# Patient Record
Sex: Male | Born: 1955 | Race: White | Hispanic: No | Marital: Married | Smoking: Former smoker
Health system: Southern US, Community
[De-identification: ages and names within clinical notes are randomized; demographics above are authoritative.]

## PROBLEM LIST (undated history)

## (undated) ENCOUNTER — Emergency Department: Admission: EM | Discharge status: Absent without leave | Payer: BLUE CROSS/BLUE SHIELD

## (undated) DIAGNOSIS — N200 Calculus of kidney: Secondary | ICD-10-CM

## (undated) DIAGNOSIS — R001 Bradycardia, unspecified: Secondary | ICD-10-CM

## (undated) DIAGNOSIS — E669 Obesity, unspecified: Secondary | ICD-10-CM

## (undated) DIAGNOSIS — M109 Gout, unspecified: Secondary | ICD-10-CM

## (undated) DIAGNOSIS — E7800 Pure hypercholesterolemia, unspecified: Secondary | ICD-10-CM

## (undated) DIAGNOSIS — I10 Essential (primary) hypertension: Secondary | ICD-10-CM

## (undated) DIAGNOSIS — K219 Gastro-esophageal reflux disease without esophagitis: Secondary | ICD-10-CM

## (undated) DIAGNOSIS — G4733 Obstructive sleep apnea (adult) (pediatric): Secondary | ICD-10-CM

## (undated) DIAGNOSIS — M48061 Spinal stenosis, lumbar region without neurogenic claudication: Secondary | ICD-10-CM

## (undated) DIAGNOSIS — N419 Inflammatory disease of prostate, unspecified: Secondary | ICD-10-CM

## (undated) DIAGNOSIS — R7303 Prediabetes: Secondary | ICD-10-CM

## (undated) DIAGNOSIS — Z87442 Personal history of urinary calculi: Secondary | ICD-10-CM

## (undated) DIAGNOSIS — N189 Chronic kidney disease, unspecified: Secondary | ICD-10-CM

## (undated) DIAGNOSIS — N281 Cyst of kidney, acquired: Secondary | ICD-10-CM

## (undated) HISTORY — PX: APPENDECTOMY: SHX54

## (undated) HISTORY — PX: OTHER SURGICAL HISTORY: SHX169

## (undated) HISTORY — PX: LITHOTRIPSY: SUR834

---

## 2014-03-04 ENCOUNTER — Emergency Department: Payer: Self-pay | Admitting: Emergency Medicine

## 2014-03-04 LAB — COMPREHENSIVE METABOLIC PANEL
Albumin: 3.7 g/dL (ref 3.4–5.0)
Alkaline Phosphatase: 85 U/L
Anion Gap: 9 (ref 7–16)
BUN: 24 mg/dL — ABNORMAL HIGH (ref 7–18)
Bilirubin,Total: 0.5 mg/dL (ref 0.2–1.0)
CO2: 20 mmol/L — AB (ref 21–32)
CREATININE: 0.86 mg/dL (ref 0.60–1.30)
Calcium, Total: 8.4 mg/dL — ABNORMAL LOW (ref 8.5–10.1)
Chloride: 110 mmol/L — ABNORMAL HIGH (ref 98–107)
EGFR (Non-African Amer.): >60
Glucose: 110 mg/dL — ABNORMAL HIGH (ref 65–99)
Osmolality: 282 (ref 275–301)
Potassium: 3.9 mmol/L (ref 3.5–5.1)
SGOT(AST): 29 U/L (ref 15–37)
SGPT (ALT): 39 U/L (ref 12–78)
SODIUM: 139 mmol/L (ref 136–145)
Total Protein: 7.2 g/dL (ref 6.4–8.2)

## 2014-03-04 LAB — URINALYSIS, COMPLETE
BILIRUBIN, UR: NEGATIVE
Bacteria: NONE SEEN
GLUCOSE, UR: NEGATIVE mg/dL (ref 0–75)
Hyaline Cast: 1
Leukocyte Esterase: NEGATIVE
NITRITE: NEGATIVE
PH: 5 (ref 4.5–8.0)
Protein: NEGATIVE
Specific Gravity: 1.023 (ref 1.003–1.030)
Squamous Epithelial: NONE SEEN
WBC UR: 2 /HPF (ref 0–5)

## 2014-03-04 LAB — CBC
HCT: 45.5 % (ref 40.0–52.0)
HGB: 15.4 g/dL (ref 13.0–18.0)
MCH: 30.9 pg (ref 26.0–34.0)
MCHC: 33.9 g/dL (ref 32.0–36.0)
MCV: 91 fL (ref 80–100)
Platelet: 166 10*3/uL (ref 150–440)
RBC: 4.99 10*6/uL (ref 4.40–5.90)
RDW: 13.6 % (ref 11.5–14.5)
WBC: 11 10*3/uL — ABNORMAL HIGH (ref 3.8–10.6)

## 2014-03-04 LAB — LIPASE, BLOOD: Lipase: 116 U/L (ref 73–393)

## 2014-03-05 LAB — URINE CULTURE

## 2014-07-21 ENCOUNTER — Emergency Department: Payer: Self-pay | Admitting: Emergency Medicine

## 2014-07-21 LAB — URINALYSIS, COMPLETE
Bacteria: NONE SEEN
Bilirubin,UR: NEGATIVE
GLUCOSE, UR: NEGATIVE mg/dL (ref 0–75)
KETONE: NEGATIVE
Leukocyte Esterase: NEGATIVE
Nitrite: NEGATIVE
PH: 5 (ref 4.5–8.0)
Protein: NEGATIVE
Specific Gravity: 1.024 (ref 1.003–1.030)
WBC UR: 3 /HPF (ref 0–5)

## 2014-07-21 LAB — COMPREHENSIVE METABOLIC PANEL
ALBUMIN: 3.7 g/dL (ref 3.4–5.0)
ANION GAP: 7 (ref 7–16)
Alkaline Phosphatase: 83 U/L
BUN: 23 mg/dL — AB (ref 7–18)
Bilirubin,Total: 0.4 mg/dL (ref 0.2–1.0)
CO2: 24 mmol/L (ref 21–32)
Calcium, Total: 8.6 mg/dL (ref 8.5–10.1)
Chloride: 109 mmol/L — ABNORMAL HIGH (ref 98–107)
Creatinine: 1.05 mg/dL (ref 0.60–1.30)
EGFR (African American): >60
EGFR (Non-African Amer.): >60
GLUCOSE: 109 mg/dL — AB (ref 65–99)
Osmolality: 284 (ref 275–301)
Potassium: 3.9 mmol/L (ref 3.5–5.1)
SGOT(AST): 16 U/L (ref 15–37)
SGPT (ALT): 30 U/L
Sodium: 140 mmol/L (ref 136–145)
Total Protein: 7.2 g/dL (ref 6.4–8.2)

## 2014-07-21 LAB — CBC
HCT: 45.7 % (ref 40.0–52.0)
HGB: 15 g/dL (ref 13.0–18.0)
MCH: 30.6 pg (ref 26.0–34.0)
MCHC: 32.8 g/dL (ref 32.0–36.0)
MCV: 93 fL (ref 80–100)
Platelet: 160 10*3/uL (ref 150–440)
RBC: 4.9 10*6/uL (ref 4.40–5.90)
RDW: 13.3 % (ref 11.5–14.5)
WBC: 9.4 10*3/uL (ref 3.8–10.6)

## 2015-08-31 DIAGNOSIS — E7800 Pure hypercholesterolemia, unspecified: Secondary | ICD-10-CM | POA: Insufficient documentation

## 2015-08-31 DIAGNOSIS — E669 Obesity, unspecified: Secondary | ICD-10-CM | POA: Insufficient documentation

## 2015-08-31 DIAGNOSIS — I10 Essential (primary) hypertension: Secondary | ICD-10-CM | POA: Insufficient documentation

## 2015-10-09 ENCOUNTER — Ambulatory Visit: Payer: BLUE CROSS/BLUE SHIELD | Attending: Neurology

## 2015-10-09 DIAGNOSIS — G4710 Hypersomnia, unspecified: Secondary | ICD-10-CM | POA: Diagnosis present

## 2015-10-09 DIAGNOSIS — Z87442 Personal history of urinary calculi: Secondary | ICD-10-CM | POA: Diagnosis not present

## 2015-10-09 DIAGNOSIS — R001 Bradycardia, unspecified: Secondary | ICD-10-CM | POA: Diagnosis not present

## 2015-10-09 DIAGNOSIS — E785 Hyperlipidemia, unspecified: Secondary | ICD-10-CM | POA: Diagnosis not present

## 2015-10-09 DIAGNOSIS — I10 Essential (primary) hypertension: Secondary | ICD-10-CM | POA: Diagnosis not present

## 2015-10-09 DIAGNOSIS — G4733 Obstructive sleep apnea (adult) (pediatric): Secondary | ICD-10-CM | POA: Insufficient documentation

## 2016-04-29 DIAGNOSIS — Z9989 Dependence on other enabling machines and devices: Secondary | ICD-10-CM | POA: Insufficient documentation

## 2016-04-29 DIAGNOSIS — G4733 Obstructive sleep apnea (adult) (pediatric): Secondary | ICD-10-CM | POA: Insufficient documentation

## 2016-06-14 ENCOUNTER — Encounter: Payer: Self-pay | Admitting: *Deleted

## 2016-06-17 ENCOUNTER — Ambulatory Visit: Payer: BLUE CROSS/BLUE SHIELD | Admitting: Anesthesiology

## 2016-06-17 ENCOUNTER — Encounter: Payer: Self-pay | Admitting: *Deleted

## 2016-06-17 ENCOUNTER — Encounter
Admission: RE | Disposition: A | Discharge status: Home / Self Care | Payer: Self-pay | Source: Ambulatory Visit | Attending: Unknown Physician Specialty

## 2016-06-17 ENCOUNTER — Ambulatory Visit
Admission: RE | Admit: 2016-06-17 | Discharge: 2016-06-17 | Disposition: A | Discharge status: Home / Self Care | Payer: BLUE CROSS/BLUE SHIELD | Source: Ambulatory Visit | Attending: Unknown Physician Specialty | Admitting: Unknown Physician Specialty

## 2016-06-17 DIAGNOSIS — Z87891 Personal history of nicotine dependence: Secondary | ICD-10-CM | POA: Diagnosis not present

## 2016-06-17 DIAGNOSIS — Z79899 Other long term (current) drug therapy: Secondary | ICD-10-CM | POA: Diagnosis not present

## 2016-06-17 DIAGNOSIS — E7800 Pure hypercholesterolemia, unspecified: Secondary | ICD-10-CM | POA: Insufficient documentation

## 2016-06-17 DIAGNOSIS — N189 Chronic kidney disease, unspecified: Secondary | ICD-10-CM | POA: Diagnosis not present

## 2016-06-17 DIAGNOSIS — R001 Bradycardia, unspecified: Secondary | ICD-10-CM | POA: Diagnosis not present

## 2016-06-17 DIAGNOSIS — Z87442 Personal history of urinary calculi: Secondary | ICD-10-CM | POA: Insufficient documentation

## 2016-06-17 DIAGNOSIS — Z6836 Body mass index (BMI) 36.0-36.9, adult: Secondary | ICD-10-CM | POA: Diagnosis not present

## 2016-06-17 DIAGNOSIS — Z1211 Encounter for screening for malignant neoplasm of colon: Secondary | ICD-10-CM | POA: Diagnosis not present

## 2016-06-17 DIAGNOSIS — Z7982 Long term (current) use of aspirin: Secondary | ICD-10-CM | POA: Diagnosis not present

## 2016-06-17 DIAGNOSIS — I129 Hypertensive chronic kidney disease with stage 1 through stage 4 chronic kidney disease, or unspecified chronic kidney disease: Secondary | ICD-10-CM | POA: Insufficient documentation

## 2016-06-17 DIAGNOSIS — K5730 Diverticulosis of large intestine without perforation or abscess without bleeding: Secondary | ICD-10-CM | POA: Insufficient documentation

## 2016-06-17 DIAGNOSIS — K640 First degree hemorrhoids: Secondary | ICD-10-CM | POA: Insufficient documentation

## 2016-06-17 DIAGNOSIS — E669 Obesity, unspecified: Secondary | ICD-10-CM | POA: Diagnosis not present

## 2016-06-17 HISTORY — DX: Bradycardia, unspecified: R00.1

## 2016-06-17 HISTORY — DX: Essential (primary) hypertension: I10

## 2016-06-17 HISTORY — DX: Obesity, unspecified: E66.9

## 2016-06-17 HISTORY — DX: Calculus of kidney: N20.0

## 2016-06-17 HISTORY — DX: Pure hypercholesterolemia, unspecified: E78.00

## 2016-06-17 HISTORY — DX: Chronic kidney disease, unspecified: N18.9

## 2016-06-17 HISTORY — PX: COLONOSCOPY WITH PROPOFOL: SHX5780

## 2016-06-17 SURGERY — COLONOSCOPY WITH PROPOFOL
Anesthesia: General

## 2016-06-17 MED ORDER — PROPOFOL 10 MG/ML IV BOLUS
INTRAVENOUS | Status: DC | PRN
  Administered 2016-06-17: 10 mg via INTRAVENOUS
  Administered 2016-06-17: 30 mg via INTRAVENOUS

## 2016-06-17 MED ORDER — SODIUM CHLORIDE 0.9 % IV SOLN: INTRAVENOUS | Status: DC

## 2016-06-17 MED ORDER — FENTANYL CITRATE (PF) 100 MCG/2ML IJ SOLN: 25.0000 ug | INTRAMUSCULAR | Status: DC | PRN

## 2016-06-17 MED ORDER — MIDAZOLAM HCL 2 MG/2ML IJ SOLN
INTRAMUSCULAR | Status: DC | PRN
  Administered 2016-06-17: 1 mg via INTRAVENOUS

## 2016-06-17 MED ORDER — SODIUM CHLORIDE 0.9 % IV SOLN
INTRAVENOUS | Status: DC
  Administered 2016-06-17: 13:00:00 via INTRAVENOUS

## 2016-06-17 MED ORDER — PROPOFOL 500 MG/50ML IV EMUL
INTRAVENOUS | Status: DC | PRN
  Administered 2016-06-17: 140 ug/kg/min via INTRAVENOUS

## 2016-06-17 MED ORDER — ONDANSETRON HCL 4 MG/2ML IJ SOLN: 4.0000 mg | Freq: Once | INTRAMUSCULAR | Status: DC | PRN

## 2016-06-17 NOTE — Op Note (Signed)
[facility] Patient Name: [name] Procedure Date: 06/17/2016 1:44 PM MRN: [id number] Account #: [id number] Date of Birth: [birth year] Admit Type: Outpatient Age: 60 Room: [facility] ENDO ROOM 1 Gender: Male Note Status: Finalized Procedure:            Colonoscopy Indications:          Screening for colorectal malignant neoplasm Providers:            Manya Silvas, MD Referring MD:         Caprice Renshaw MD (Referring MD) Medicines:            Propofol per Anesthesia Complications:        No immediate complications. Procedure:            Pre-Anesthesia Assessment:                       - After reviewing the risks and benefits, the patient                        was deemed in satisfactory condition to undergo the                        procedure.                       After obtaining informed consent, the colonoscope was                        passed under direct vision. Throughout the procedure,                        the patient's blood pressure, pulse, and oxygen                        saturations were monitored continuously. The was                        introduced through the anus and advanced to the the                        cecum, identified by appendiceal orifice and ileocecal                        valve. The colonoscopy was performed without                        difficulty. The patient tolerated the procedure well.                        The quality of the bowel preparation was good. Findings:      A few small-mouthed diverticula were found in the sigmoid colon.      Internal hemorrhoids were found during endoscopy. The hemorrhoids were       small and Grade I (internal hemorrhoids that do not prolapse). Impression:           - Diverticulosis in the sigmoid colon.                       - Internal hemorrhoids.                       -  No specimens collected. Recommendation:       - Repeat colonoscopy in 10 years for screening  purposes. Manya Silvas, MD 06/17/2016 2:05:45 PM This report has been signed electronically. Number of Addenda: 0 Note Initiated On: 06/17/2016 1:44 PM Scope Withdrawal Time: 0 hours 8 minutes 26 seconds  Total Procedure Duration: 0 hours 12 minutes 59 seconds       [facility]

## 2016-06-17 NOTE — H&P (Signed)
   Primary Care Physician:  Marcello Fennel, MD Primary Gastroenterologist:  Dr. Vira Agar  Pre-Procedure History & Physical: HPI:  Louis Jackson is a 60 y.o. male is here for an colonoscopy.   Past Medical History:  Diagnosis Date  . Bradycardia   . Chronic kidney disease   . Hypertension   . Nephrolithiasis   . Obesity   . Pure hypercholesterolemia, unspecified     Past Surgical History:  Procedure Laterality Date  . APPENDECTOMY    . lithrotripsy      Prior to Admission medications   Medication Sig Start Date End Date Taking? Authorizing Provider  aspirin 81 MG tablet Take 81 mg by mouth daily.   Yes Historical Provider, MD  olmesartan-hydrochlorothiazide (BENICAR HCT) 20-12.5 MG tablet Take 1 tablet by mouth daily.   Yes Historical Provider, MD  rosuvastatin (CRESTOR) 10 MG tablet Take 10 mg by mouth daily.   Yes Historical Provider, MD    Allergies as of 05/16/2016  . (Not on File)    History reviewed. No pertinent family history.  Social History   Social History  . Marital status: Married    Spouse name: N/A  . Number of children: N/A  . Years of education: N/A   Occupational History  . Not on file.   Social History Main Topics  . Smoking status: Never Smoker  . Smokeless tobacco: Former Systems developer  . Alcohol use 8.4 oz/week    14 Cans of beer per week  . Drug use: No  . Sexual activity: Not on file   Other Topics Concern  . Not on file   Social History Narrative  . No narrative on file    Review of Systems: See HPI, otherwise negative ROS  Physical Exam: BP 124/72   Pulse 65   Temp 98.3 F (36.8 C) (Tympanic)   Resp 18   Ht 5\' 11"  (1.803 m)   Wt 120.2 kg (265 lb)   SpO2 98%   BMI 36.96 kg/m  General:   Alert,  pleasant and cooperative in NAD Head:  Normocephalic and atraumatic. Neck:  Supple; no masses or thyromegaly. Lungs:  Clear throughout to auscultation.    Heart:  Regular rate and rhythm. Abdomen:  Soft, nontender and  nondistended. Normal bowel sounds, without guarding, and without rebound.   Neurologic:  Alert and  oriented x4;  grossly normal neurologically.  Impression/Plan: Louis Jackson is here for an colonoscopy to be performed for screening exam of colon.  Risks, benefits, limitations, and alternatives regarding  colonoscopy have been reviewed with the patient.  Questions have been answered.  All parties agreeable.   Gaylyn Cheers, MD  06/17/2016, 1:38 PM

## 2016-06-17 NOTE — Anesthesia Preprocedure Evaluation (Addendum)
Anesthesia Evaluation  Patient identified by MRN, date of birth, ID band Patient awake    Reviewed: Allergy & Precautions, NPO status , Patient's Chart, lab work & pertinent test results  Airway Mallampati: II  TM Distance: >3 FB     Dental  (+) Caps   Pulmonary neg pulmonary ROS, sleep apnea and Continuous Positive Airway Pressure Ventilation ,    Pulmonary exam normal        Cardiovascular hypertension, Pt. on medications Normal cardiovascular exam  bradycardia   Neuro/Psych negative neurological ROS  negative psych ROS   GI/Hepatic negative GI ROS, Neg liver ROS,   Endo/Other  negative endocrine ROS  Renal/GU Renal InsufficiencyRenal disease  negative genitourinary   Musculoskeletal negative musculoskeletal ROS (+)   Abdominal Normal abdominal exam  (+)   Peds negative pediatric ROS (+)  Hematology negative hematology ROS (+)   Anesthesia Other Findings   Reproductive/Obstetrics                            Anesthesia Physical Anesthesia Plan  ASA: III  Anesthesia Plan: General   Post-op Pain Management:    Induction: Intravenous  Airway Management Planned: Nasal Cannula  Additional Equipment:   Intra-op Plan:   Post-operative Plan:   Informed Consent: I have reviewed the patients History and Physical, chart, labs and discussed the procedure including the risks, benefits and alternatives for the proposed anesthesia with the patient or authorized representative who has indicated his/her understanding and acceptance.   Dental advisory given  Plan Discussed with: CRNA and Surgeon  Anesthesia Plan Comments:         Anesthesia Quick Evaluation

## 2016-06-17 NOTE — Transfer of Care (Signed)
Immediate Anesthesia Transfer of Care Note  Patient: Louis Jackson  Procedure(s) Performed: Procedure(s): COLONOSCOPY WITH PROPOFOL (N/A)  Patient Location: PACU  Anesthesia Type:General  Level of Consciousness: sedated  Airway & Oxygen Therapy: Patient Spontanous Breathing and Patient connected to nasal cannula oxygen  Post-op Assessment: Report given to RN and Post -op Vital signs reviewed and stable  Post vital signs: Reviewed and stable  Last Vitals:  Vitals:   06/17/16 1238 06/17/16 1407  BP: 124/72 119/78  Pulse: 65 66  Resp: 18 14  Temp: 36.8 C (!) 35.8 C    Last Pain:  Vitals:   06/17/16 1407  TempSrc: Tympanic         Complications: No apparent anesthesia complications

## 2016-06-17 NOTE — Anesthesia Procedure Notes (Signed)
Date/Time: 06/17/2016 1:45 PM Performed by: Johnna Acosta Pre-anesthesia Checklist: Patient identified, Emergency Drugs available, Suction available, Patient being monitored and Timeout performed Patient Re-evaluated:Patient Re-evaluated prior to inductionOxygen Delivery Method: Nasal cannula

## 2016-06-18 ENCOUNTER — Encounter: Payer: Self-pay | Admitting: Unknown Physician Specialty

## 2016-06-18 NOTE — Anesthesia Postprocedure Evaluation (Signed)
Anesthesia Post Note  Patient: Louis Jackson  Procedure(s) Performed: Procedure(s) (LRB): COLONOSCOPY WITH PROPOFOL (N/A)  Patient location during evaluation: PACU Anesthesia Type: General Level of consciousness: awake and alert and oriented Pain management: pain level controlled Vital Signs Assessment: post-procedure vital signs reviewed and stable Respiratory status: spontaneous breathing Cardiovascular status: blood pressure returned to baseline Anesthetic complications: no    Last Vitals:  Vitals:   06/17/16 1418 06/17/16 1439  BP: 109/64 113/73  Pulse: 62 (!) 55  Resp: 19 (!) 21  Temp:      Last Pain:  Vitals:   06/17/16 1407  TempSrc: Tympanic                 Ercel Normoyle

## 2017-04-11 ENCOUNTER — Emergency Department
Admission: EM | Admit: 2017-04-11 | Discharge: 2017-04-11 | Disposition: A | Discharge status: Home / Self Care | Payer: BLUE CROSS/BLUE SHIELD | Attending: Emergency Medicine | Admitting: Emergency Medicine

## 2017-04-11 ENCOUNTER — Emergency Department: Payer: BLUE CROSS/BLUE SHIELD

## 2017-04-11 ENCOUNTER — Encounter: Payer: Self-pay | Admitting: Emergency Medicine

## 2017-04-11 DIAGNOSIS — I10 Essential (primary) hypertension: Secondary | ICD-10-CM | POA: Diagnosis not present

## 2017-04-11 DIAGNOSIS — R1084 Generalized abdominal pain: Secondary | ICD-10-CM | POA: Diagnosis present

## 2017-04-11 DIAGNOSIS — K5792 Diverticulitis of intestine, part unspecified, without perforation or abscess without bleeding: Secondary | ICD-10-CM

## 2017-04-11 LAB — URINALYSIS, COMPLETE (UACMP) WITH MICROSCOPIC
Bacteria, UA: NONE SEEN
Bilirubin Urine: NEGATIVE
Glucose, UA: NEGATIVE mg/dL
KETONES UR: NEGATIVE mg/dL
Leukocytes, UA: NEGATIVE
Nitrite: NEGATIVE
PH: 5 (ref 5.0–8.0)
PROTEIN: NEGATIVE mg/dL
Specific Gravity, Urine: 1.021 (ref 1.005–1.030)

## 2017-04-11 LAB — BASIC METABOLIC PANEL
Anion gap: 7 (ref 5–15)
BUN: 22 mg/dL — AB (ref 6–20)
CO2: 24 mmol/L (ref 22–32)
CREATININE: 1.05 mg/dL (ref 0.61–1.24)
Calcium: 8.7 mg/dL — ABNORMAL LOW (ref 8.9–10.3)
Chloride: 106 mmol/L (ref 101–111)
GFR calc Af Amer: >60 mL/min (ref >60)
GLUCOSE: 104 mg/dL — AB (ref 65–99)
POTASSIUM: 3.8 mmol/L (ref 3.5–5.1)
SODIUM: 137 mmol/L (ref 135–145)

## 2017-04-11 LAB — CBC WITH DIFFERENTIAL/PLATELET
Basophils Absolute: 0.1 10*3/uL (ref 0–0.1)
Basophils Relative: 1 %
EOS ABS: 0.1 10*3/uL (ref 0–0.7)
EOS PCT: 1 %
HCT: 42.3 % (ref 40.0–52.0)
Hemoglobin: 14.4 g/dL (ref 13.0–18.0)
LYMPHS ABS: 1.9 10*3/uL (ref 1.0–3.6)
LYMPHS PCT: 16 %
MCH: 30.9 pg (ref 26.0–34.0)
MCHC: 34.1 g/dL (ref 32.0–36.0)
MCV: 90.7 fL (ref 80.0–100.0)
MONO ABS: 0.9 10*3/uL (ref 0.2–1.0)
MONOS PCT: 8 %
Neutro Abs: 8.5 10*3/uL — ABNORMAL HIGH (ref 1.4–6.5)
Neutrophils Relative %: 74 %
PLATELETS: 168 10*3/uL (ref 150–440)
RBC: 4.66 MIL/uL (ref 4.40–5.90)
RDW: 13.4 % (ref 11.5–14.5)
WBC: 11.5 10*3/uL — ABNORMAL HIGH (ref 3.8–10.6)

## 2017-04-11 MED ORDER — AMOXICILLIN-POT CLAVULANATE 875-125 MG PO TABS: 1.0000 | ORAL_TABLET | Freq: Two times a day (BID) | ORAL | 0 refills | Status: AC

## 2017-04-11 MED ORDER — MORPHINE SULFATE (PF) 4 MG/ML IV SOLN
4.0000 mg | Freq: Once | INTRAVENOUS | Status: AC
  Administered 2017-04-11: 4 mg via INTRAVENOUS
  Filled 2017-04-11: qty 1

## 2017-04-11 MED ORDER — MORPHINE SULFATE (PF) 2 MG/ML IV SOLN
2.0000 mg | Freq: Once | INTRAVENOUS | Status: DC
Start: 2017-04-11 — End: 2017-04-11

## 2017-04-11 MED ORDER — KETOROLAC TROMETHAMINE 30 MG/ML IJ SOLN: 15.0000 mg | Freq: Once | INTRAMUSCULAR | Status: DC

## 2017-04-11 MED ORDER — METRONIDAZOLE 500 MG PO TABS
500.0000 mg | ORAL_TABLET | Freq: Once | ORAL | Status: AC
  Administered 2017-04-11: 500 mg via ORAL
  Filled 2017-04-11: qty 1

## 2017-04-11 MED ORDER — SODIUM CHLORIDE 0.9 % IV BOLUS (SEPSIS)
1000.0000 mL | Freq: Once | INTRAVENOUS | Status: AC
  Administered 2017-04-11: 1000 mL via INTRAVENOUS

## 2017-04-11 MED ORDER — ONDANSETRON 4 MG PO TBDP: 4.0000 mg | ORAL_TABLET | Freq: Three times a day (TID) | ORAL | 0 refills | Status: DC | PRN

## 2017-04-11 MED ORDER — METRONIDAZOLE 500 MG PO TABS: 500.0000 mg | ORAL_TABLET | Freq: Three times a day (TID) | ORAL | 0 refills | Status: AC

## 2017-04-11 MED ORDER — AMOXICILLIN-POT CLAVULANATE 875-125 MG PO TABS
1.0000 | ORAL_TABLET | Freq: Once | ORAL | Status: AC
  Administered 2017-04-11: 1 via ORAL
  Filled 2017-04-11: qty 1

## 2017-04-11 MED ORDER — OXYCODONE-ACETAMINOPHEN 5-325 MG PO TABS: 1.0000 | ORAL_TABLET | Freq: Four times a day (QID) | ORAL | 0 refills | Status: AC | PRN

## 2017-04-11 NOTE — ED Provider Notes (Signed)
[facility] Emergency Department Provider Note  ____________________________________________  Time seen: Approximately 7:44 AM  I have reviewed the triage vital signs and the nursing notes.   HISTORY  Chief Complaint Flank Pain   HPI KEALII THUESON is a 61 y.o. male with a history of multiple prior kidney stones requiring lithotripsy 3 times, hypertension, hyperlipidemia who presents for evaluation of left flank pain. Patient reports 2 days of intermittent cramping left flank pain radiating to his left groin. Patient reports pain is similar to his prior history of kidney stones. He reports that since last night his been having difficulty urinating which made him concerned and brought him to the emergency room. He reports that the pain is currently at 2 and every time he tries to urinate goes up to 10, he denies fever or chills, nausea or vomiting, dysuria or hematuria, chest pain or shortness of breath.  Past Medical History:  Diagnosis Date  . Bradycardia   . Chronic kidney disease   . Hypertension   . Nephrolithiasis   . Obesity   . Pure hypercholesterolemia, unspecified     There are no active problems to display for this patient.   Past Surgical History:  Procedure Laterality Date  . APPENDECTOMY    . COLONOSCOPY WITH PROPOFOL N/A 06/17/2016   Procedure: COLONOSCOPY WITH PROPOFOL;  Surgeon: Manya Silvas, MD;  Location: [facility] ENDOSCOPY;  Service: Endoscopy;  Laterality: N/A;  . lithrotripsy      Prior to Admission medications   Medication Sig Start Date End Date Taking? Authorizing Provider  amoxicillin-clavulanate (AUGMENTIN) 875-125 MG tablet Take 1 tablet by mouth 2 (two) times daily. 04/11/17 04/21/17  Rudene Re, MD  aspirin 81 MG tablet Take 81 mg by mouth daily.    [provider]  metroNIDAZOLE (FLAGYL) 500 MG tablet Take 1 tablet (500 mg total) by mouth 3 (three) times daily. 04/11/17 04/21/17  Rudene Re, MD    olmesartan-hydrochlorothiazide (BENICAR HCT) 20-12.5 MG tablet Take 1 tablet by mouth daily.    [provider]  ondansetron (ZOFRAN ODT) 4 MG disintegrating tablet Take 1 tablet (4 mg total) by mouth every 8 (eight) hours as needed for nausea or vomiting. 04/11/17   Rudene Re, MD  oxyCODONE-acetaminophen (ROXICET) 5-325 MG tablet Take 1 tablet by mouth every 6 (six) hours as needed. 04/11/17 04/11/18  Rudene Re, MD  rosuvastatin (CRESTOR) 10 MG tablet Take 10 mg by mouth daily.    [provider]    Allergies Patient has no known allergies.  History reviewed. No pertinent family history.  Social History Social History  Substance Use Topics  . Smoking status: Never Smoker  . Smokeless tobacco: Former Systems developer  . Alcohol use 8.4 oz/week    14 Cans of beer per week    Review of Systems  Constitutional: Negative for fever. Eyes: Negative for visual changes. ENT: Negative for sore throat. Neck: No neck pain  Cardiovascular: Negative for chest pain. Respiratory: Negative for shortness of breath. Gastrointestinal: Negative for abdominal pain, vomiting or diarrhea. Genitourinary: Negative for dysuria. + L flank pain Musculoskeletal: Negative for back pain. Skin: Negative for rash. Neurological: Negative for headaches, weakness or numbness. Psych: No SI or HI  ____________________________________________   PHYSICAL EXAM:  VITAL SIGNS: ED Triage Vitals [04/11/17 0737]  Enc Vitals Group     BP (!) 145/84     Pulse Rate 70     Resp 19     Temp 98.4 F (36.9 C)  Temp Source Oral     SpO2 97 %     Weight 270 lb (122.5 kg)     Height 5\' 11"  (1.803 m)     Head Circumference      Peak Flow      Pain Score 2     Pain Loc      Pain Edu?      Excl. in [place]?     Constitutional: Alert and oriented. Well appearing and in no apparent distress. HEENT:      Head: Normocephalic and atraumatic.         Eyes: Conjunctivae are normal. Sclera is  non-icteric.       Mouth/Throat: Mucous membranes are moist.       Neck: Supple with no signs of meningismus. Cardiovascular: Regular rate and rhythm. No murmurs, gallops, or rubs. 2+ symmetrical distal pulses are present in all extremities. No JVD. Respiratory: Normal respiratory effort. Lungs are clear to auscultation bilaterally. No wheezes, crackles, or rhonchi.  Gastrointestinal: Soft, non tender, and non distended with positive bowel sounds. No rebound or guarding. Genitourinary: No CVA tenderness. Musculoskeletal: Nontender with normal range of motion in all extremities. No edema, cyanosis, or erythema of extremities. Neurologic: Normal speech and language. Face is symmetric. Moving all extremities. No gross focal neurologic deficits are appreciated. Skin: Skin is warm, dry and intact. No rash noted. Psychiatric: Mood and affect are normal. Speech and behavior are normal.  ____________________________________________   LABS (all labs ordered are listed, but only abnormal results are displayed)  Labs Reviewed  CBC WITH DIFFERENTIAL/PLATELET - Abnormal; Notable for the following:       Result Value   WBC 11.5 (*)    Neutro Abs 8.5 (*)    All other components within normal limits  BASIC METABOLIC PANEL - Abnormal; Notable for the following:    Glucose, Bld 104 (*)    BUN 22 (*)    Calcium 8.7 (*)    All other components within normal limits  URINALYSIS, COMPLETE (UACMP) WITH MICROSCOPIC - Abnormal; Notable for the following:    Color, Urine YELLOW (*)    APPearance CLEAR (*)    Hgb urine dipstick MODERATE (*)    Squamous Epithelial / LPF 0-5 (*)    All other components within normal limits   ____________________________________________  EKG  none  ____________________________________________  RADIOLOGY  CT renal: 1. A sigmoid diverticulitis without evidence for abscess or perforation. Edema aunt reactive adenopathy is present within the mesenteric. 2. Three punctate  nonobstructing stones in the right kidney without significant left-sided nephrolithiasis. 3. Stable bilateral renal cystic disease. 4. Aortic Atherosclerosis (ICD10-I70.0). ____________________________________________   PROCEDURES  Procedure(s) performed: None Procedures Critical Care performed:  None ____________________________________________   INITIAL IMPRESSION / ASSESSMENT AND PLAN / ED COURSE  61 y.o. male with a history of multiple prior kidney stones requiring lithotripsy 3 times, hypertension, hyperlipidemia who presents for evaluation of 2 days of left flank pain. Patient is extremely well appearing, no distress, has normal vital signs, no abdominal tenderness or flank tenderness. We'll send patient for CT renal to confirm kidney stone vs possible alternative etiology such as diverticulitis. We'll check UA to rule out UTI. We'll check basic blood work to rule out acute kidney injury. We'll give IV fluids, IV morphine and reassess.    _________________________ 9:20 AM on 04/11/2017 -----------------------------------------  CT concerning for Uncomplicated diverticulitis. Stable left-sided renal cysts with no evidence of ureteral stones. Patient remains extremely well-appearing with well-controlled pain. Vitals are  within normal limits. Blood work showing mild leukocytosis of 11.5 and stable kidney function. Patient was started on Augmentin and Flagyl. He is going be discharged home on a 10 day course also with Zofran and Percocet. Recommend close follow-up with primary care doctor. Recommended return to the emergency room if he has worsening abdominal pain, or fever.  Pertinent labs & imaging results that were available during my care of the patient were reviewed by me and considered in my medical decision making (see chart for details).    ____________________________________________   FINAL CLINICAL IMPRESSION(S) / ED DIAGNOSES  Final diagnoses:  Diverticulitis       NEW MEDICATIONS STARTED DURING THIS VISIT:  New Prescriptions   AMOXICILLIN-CLAVULANATE (AUGMENTIN) 875-125 MG TABLET    Take 1 tablet by mouth 2 (two) times daily.   METRONIDAZOLE (FLAGYL) 500 MG TABLET    Take 1 tablet (500 mg total) by mouth 3 (three) times daily.   ONDANSETRON (ZOFRAN ODT) 4 MG DISINTEGRATING TABLET    Take 1 tablet (4 mg total) by mouth every 8 (eight) hours as needed for nausea or vomiting.   OXYCODONE-ACETAMINOPHEN (ROXICET) 5-325 MG TABLET    Take 1 tablet by mouth every 6 (six) hours as needed.     Note:  This document was prepared using Dragon voice recognition software and may include unintentional dictation errors.    Rudene Re, MD 04/11/17 [phone]

## 2017-04-11 NOTE — ED Triage Notes (Signed)
Pt thinks has another kidney stone. Hx 46 on left side and 3 on right.  Started with mid back/left flank pain 2 days ago. Pain wraps to LLQ.  Ambulatory to triage. NAD. VSS

## 2017-06-02 ENCOUNTER — Ambulatory Visit: Payer: Self-pay | Admitting: Podiatry

## 2020-06-01 ENCOUNTER — Emergency Department: Payer: Medicare Other

## 2020-06-01 ENCOUNTER — Other Ambulatory Visit: Payer: Self-pay

## 2020-06-01 ENCOUNTER — Emergency Department
Admission: EM | Admit: 2020-06-01 | Discharge: 2020-06-01 | Disposition: A | Discharge status: Home / Self Care | Payer: Medicare Other | Attending: Emergency Medicine | Admitting: Emergency Medicine

## 2020-06-01 ENCOUNTER — Encounter: Payer: Self-pay | Admitting: Emergency Medicine

## 2020-06-01 DIAGNOSIS — E7800 Pure hypercholesterolemia, unspecified: Secondary | ICD-10-CM | POA: Diagnosis not present

## 2020-06-01 DIAGNOSIS — I10 Essential (primary) hypertension: Secondary | ICD-10-CM | POA: Diagnosis not present

## 2020-06-01 DIAGNOSIS — M79605 Pain in left leg: Secondary | ICD-10-CM | POA: Diagnosis not present

## 2020-06-01 MED ORDER — OXYCODONE-ACETAMINOPHEN 5-325 MG PO TABS: 1.0000 | ORAL_TABLET | ORAL | 0 refills | Status: AC | PRN

## 2020-06-01 MED ORDER — OXYCODONE-ACETAMINOPHEN 5-325 MG PO TABS
1.0000 | ORAL_TABLET | Freq: Once | ORAL | Status: AC
  Administered 2020-06-01: 1 via ORAL
  Filled 2020-06-01: qty 1

## 2020-06-01 MED ORDER — IBUPROFEN 600 MG PO TABS: 600.0000 mg | ORAL_TABLET | Freq: Four times a day (QID) | ORAL | 0 refills | Status: DC | PRN

## 2020-06-01 NOTE — ED Triage Notes (Signed)
C/O left calf pain x 5 days.  Pain worsened over the week.  Drove home from [place] Sunday.  Initially seen at [facility], referred to ED for evaluation.

## 2020-06-01 NOTE — Discharge Instructions (Signed)
Rest, ice, and elevate the leg as needed.  You may also use an Ace wrap for compression and support when you are walking.  Take the ibuprofen to decrease inflammation, and the Percocet on top of this as needed only for more severe pain or at night.  Return to the ER for new, worsening, or persistent severe leg pain or any swelling, weakness or numbness, inability to put weight on the leg, rash or redness spreading up the leg, or any other new or worsening symptoms that concern you.

## 2020-06-01 NOTE — ED Notes (Signed)
US at bedside

## 2020-06-01 NOTE — ED Provider Notes (Signed)
[facility] Emergency Department Provider Note ____________________________________________   First MD Initiated Contact with Patient 06/01/20 [id number]     (approximate)  I have reviewed the triage vital signs and the nursing notes.   HISTORY  Chief Complaint Leg Pain    HPI Louis Jackson is a 63 y.o. male with PMH as noted below who presents with left leg pain for approximately the last 5 days, somewhat improved after the first 1 to 2 days, now worsened.  The pain initially started around the calf, but now has migrated more towards the anterior lower leg and shin area as well as the ankle.  He denies any known fall or other trauma.  He has no numbness or weakness.  He has had some pain rating up towards the thigh and hip, but not constantly.  He has no prior history of similar symptoms.  He drove here from [place] 3 days ago, but this was already after the pain started.  Past Medical History:  Diagnosis Date  . Bradycardia   . Chronic kidney disease   . Hypertension   . Nephrolithiasis   . Obesity   . Pure hypercholesterolemia, unspecified     There are no problems to display for this patient.   Past Surgical History:  Procedure Laterality Date  . APPENDECTOMY    . COLONOSCOPY WITH PROPOFOL N/A 06/17/2016   Procedure: COLONOSCOPY WITH PROPOFOL;  Surgeon: Manya Silvas, MD;  Location: [facility] ENDOSCOPY;  Service: Endoscopy;  Laterality: N/A;  . lithrotripsy      Prior to Admission medications   Medication Sig Start Date End Date Taking? Authorizing Provider  aspirin 81 MG tablet Take 81 mg by mouth daily.    [provider]  ibuprofen (ADVIL) 600 MG tablet Take 1 tablet (600 mg total) by mouth every 6 (six) hours as needed. 06/01/20   Arta Silence, MD  olmesartan-hydrochlorothiazide (BENICAR HCT) 20-12.5 MG tablet Take 1 tablet by mouth daily.    [provider]  ondansetron (ZOFRAN ODT) 4 MG disintegrating tablet Take 1  tablet (4 mg total) by mouth every 8 (eight) hours as needed for nausea or vomiting. 04/11/17   Rudene Re, MD  oxyCODONE-acetaminophen (PERCOCET) 5-325 MG tablet Take 1 tablet by mouth every 4 (four) hours as needed for up to 5 days for severe pain. 06/01/20 06/06/20  Arta Silence, MD  rosuvastatin (CRESTOR) 10 MG tablet Take 10 mg by mouth daily.    [provider]    Allergies Patient has no known allergies.  No family history on file.  Social History Social History   Tobacco Use  . Smoking status: Never Smoker  . Smokeless tobacco: Former Network engineer Use Topics  . Alcohol use: Yes    Alcohol/week: 14.0 standard drinks    Types: 14 Cans of beer per week  . Drug use: No    Review of Systems  Constitutional: No fever/chills Eyes: No visual changes. ENT: No sore throat. Cardiovascular: Denies chest pain. Respiratory: Denies shortness of breath. Gastrointestinal: No vomiting or diarrhea.  Genitourinary: Negative for dysuria.  Musculoskeletal: Negative for back pain.  Positive for left leg pain. Skin: Negative for rash. Neurological: Negative for focal weakness or numbness.   ____________________________________________   PHYSICAL EXAM:  VITAL SIGNS: ED Triage Vitals  Enc Vitals Group     BP 06/01/20 0938 (!) 138/107     Pulse Rate 06/01/20 0938 (!) 59     Resp 06/01/20 0938 18  Temp 06/01/20 0938 98.2 F (36.8 C)     Temp Source 06/01/20 0938 Oral     SpO2 06/01/20 0938 99 %     Weight 06/01/20 0920 270 lb 1 oz (122.5 kg)     Height 06/01/20 0920 5\' 11"  (1.803 m)     Head Circumference --      Peak Flow --      Pain Score 06/01/20 0919 7     Pain Loc --      Pain Edu? --      Excl. in [place]? --     Constitutional: Alert and oriented. Well appearing and in no acute distress. Eyes: Conjunctivae are normal.  Head: Atraumatic. Nose: No congestion/rhinnorhea. Mouth/Throat: Mucous membranes are moist.   Neck: Normal range of motion.    Cardiovascular: Normal rate, regular rhythm.  Good peripheral circulation. Respiratory: Normal respiratory effort.  No retractions.  Gastrointestinal: No distention.  Musculoskeletal: No lower extremity edema.  Extremities warm and well perfused.  Minimal tenderness to the left anterior lower leg.  No calf or popliteal tenderness.  Full range of motion at left hip, knee, and ankle.  2+ DP pulse.  Cap refill less than 2 seconds.  Normal color.  No erythema, induration, or abnormal warmth. Neurologic:  Normal speech and language. No gross focal neurologic deficits are appreciated.  Skin:  Skin is warm and dry. No rash noted. Psychiatric: Mood and affect are normal. Speech and behavior are normal.  ____________________________________________   LABS (all labs ordered are listed, but only abnormal results are displayed)  Labs Reviewed - No data to display ____________________________________________  EKG   ____________________________________________  RADIOLOGY  US venous LLE: No acute fracture XR L tib/fib: No acute bony abnormality  ____________________________________________   PROCEDURES  Procedure(s) performed: No  Procedures  Critical Care performed: No ____________________________________________   INITIAL IMPRESSION / ASSESSMENT AND PLAN / ED COURSE  Pertinent labs & imaging results that were available during my care of the patient were reviewed by me and considered in my medical decision making (see chart for details).  64 year old male with PMH as noted above presents with left lower leg pain for about 5 days, initially more in the calf, but now localized to the shin area.  He has no trauma.  He had a long drive recently, but the pain started before this.  On exam, he is well-appearing.  His vital signs are normal except for mild hypertension.  He has minimal tenderness to the left anterior lower leg, but no other significant findings on exam.  There is no  significant swelling.  He has full range of motion at all joints, and the left leg is neuro/vascular intact.  Differential includes shin splint or other benign musculoskeletal pain, sciatica or other peripheral nerve pain, versus less likely DVT or occult bony trauma.  We will obtain DVT ultrasound and tib/fib x-ray.  ----------------------------------------- 1:03 PM on 06/01/2020 -----------------------------------------  Ultrasound and x-ray are within normal limits.  Overall presentation is consistent with muscle strain/spasm, shinsplints, neuropathic pain, or other benign etiology.  The patient is stable for discharge home.  I counseled him on the results of the work-up.  I will prescribe some analgesia.  Return precautions given, and he expresses understanding. ____________________________________________   FINAL CLINICAL IMPRESSION(S) / ED DIAGNOSES  Final diagnoses:  Left leg pain      NEW MEDICATIONS STARTED DURING THIS VISIT:  New Prescriptions   IBUPROFEN (ADVIL) 600 MG TABLET    Take 1 tablet (  600 mg total) by mouth every 6 (six) hours as needed.   OXYCODONE-ACETAMINOPHEN (PERCOCET) 5-325 MG TABLET    Take 1 tablet by mouth every 4 (four) hours as needed for up to 5 days for severe pain.     Note:  This document was prepared using Dragon voice recognition software and may include unintentional dictation errors.    Arta Silence, MD 06/01/20 1304

## 2020-06-01 NOTE — ED Notes (Signed)
Pt states he has pain in left shin that is constant- pt states it is not tender to touch- pt states it is not getting any worse or any better- pt denies redness or swelling to the calf

## 2020-06-04 ENCOUNTER — Emergency Department
Admission: EM | Admit: 2020-06-04 | Discharge: 2020-06-04 | Disposition: A | Discharge status: Home / Self Care | Payer: Medicare Other | Attending: Emergency Medicine | Admitting: Emergency Medicine

## 2020-06-04 ENCOUNTER — Other Ambulatory Visit: Payer: Self-pay

## 2020-06-04 ENCOUNTER — Emergency Department: Payer: Medicare Other

## 2020-06-04 DIAGNOSIS — M545 Low back pain, unspecified: Secondary | ICD-10-CM

## 2020-06-04 DIAGNOSIS — N189 Chronic kidney disease, unspecified: Secondary | ICD-10-CM | POA: Insufficient documentation

## 2020-06-04 DIAGNOSIS — I129 Hypertensive chronic kidney disease with stage 1 through stage 4 chronic kidney disease, or unspecified chronic kidney disease: Secondary | ICD-10-CM | POA: Insufficient documentation

## 2020-06-04 DIAGNOSIS — R109 Unspecified abdominal pain: Secondary | ICD-10-CM | POA: Insufficient documentation

## 2020-06-04 LAB — URINALYSIS, COMPLETE (UACMP) WITH MICROSCOPIC
Bacteria, UA: NONE SEEN
Bilirubin Urine: NEGATIVE
Glucose, UA: NEGATIVE mg/dL
Ketones, ur: NEGATIVE mg/dL
Leukocytes,Ua: NEGATIVE
Nitrite: NEGATIVE
Protein, ur: NEGATIVE mg/dL
Specific Gravity, Urine: 1.016 (ref 1.005–1.030)
Squamous Epithelial / LPF: NONE SEEN (ref 0–5)
pH: 5 (ref 5.0–8.0)

## 2020-06-04 LAB — BASIC METABOLIC PANEL
Anion gap: 8 (ref 5–15)
BUN: 34 mg/dL — ABNORMAL HIGH (ref 8–23)
CO2: 23 mmol/L (ref 22–32)
Calcium: 9 mg/dL (ref 8.9–10.3)
Chloride: 106 mmol/L (ref 98–111)
Creatinine, Ser: 1.11 mg/dL (ref 0.61–1.24)
GFR calc Af Amer: >60 mL/min (ref >60)
GFR calc non Af Amer: >60 mL/min (ref >60)
Glucose, Bld: 106 mg/dL — ABNORMAL HIGH (ref 70–99)
Potassium: 4.3 mmol/L (ref 3.5–5.1)
Sodium: 137 mmol/L (ref 135–145)

## 2020-06-04 LAB — CBC
HCT: 44.3 % (ref 39.0–52.0)
Hemoglobin: 15 g/dL (ref 13.0–17.0)
MCH: 31.3 pg (ref 26.0–34.0)
MCHC: 33.9 g/dL (ref 30.0–36.0)
MCV: 92.3 fL (ref 80.0–100.0)
Platelets: 155 10*3/uL (ref 150–400)
RBC: 4.8 MIL/uL (ref 4.22–5.81)
RDW: 12.9 % (ref 11.5–15.5)
WBC: 6.7 10*3/uL (ref 4.0–10.5)
nRBC: 0 % (ref 0.0–0.2)

## 2020-06-04 MED ORDER — SODIUM CHLORIDE 0.9 % IV SOLN: Freq: Once | INTRAVENOUS | Status: AC

## 2020-06-04 MED ORDER — HYDROMORPHONE HCL 1 MG/ML IJ SOLN
1.0000 mg | Freq: Once | INTRAMUSCULAR | Status: AC
  Administered 2020-06-04: 1 mg via INTRAVENOUS
  Filled 2020-06-04: qty 1

## 2020-06-04 MED ORDER — DEXAMETHASONE SODIUM PHOSPHATE 10 MG/ML IJ SOLN
10.0000 mg | Freq: Once | INTRAMUSCULAR | Status: AC
  Administered 2020-06-04: 10 mg via INTRAVENOUS
  Filled 2020-06-04: qty 1

## 2020-06-04 MED ORDER — HYDROCODONE-ACETAMINOPHEN 5-325 MG PO TABS: 1.0000 | ORAL_TABLET | Freq: Four times a day (QID) | ORAL | 0 refills | Status: DC | PRN

## 2020-06-04 MED ORDER — PREDNISONE 10 MG (21) PO TBPK: ORAL_TABLET | ORAL | 0 refills | Status: DC

## 2020-06-04 MED ORDER — KETOROLAC TROMETHAMINE 30 MG/ML IJ SOLN
15.0000 mg | Freq: Once | INTRAMUSCULAR | Status: AC
  Administered 2020-06-04: 15 mg via INTRAVENOUS
  Filled 2020-06-04: qty 1

## 2020-06-04 NOTE — ED Triage Notes (Addendum)
Diagnosed last week with sciatica and left shin splints and states that this am his left flank began to hurt, states hx of 48 kidney stones in the past, states noticed that his urine flow has been slow today as well. Positive pedal pulse dopplered in triage to the left foot

## 2020-06-04 NOTE — ED Provider Notes (Signed)
ER Provider Note       Time seen: 3:01 PM    I have reviewed the vital signs and the nursing notes.  HISTORY   Chief Complaint Flank Pain   HPI Louis Jackson is a 64 y.o. male with a history of bradycardia, chronic kidney disease, hypertension, kidney stones who presents today for left flank pain with a history of multiple kidney stones in the past.  Patient reports recently being seen for sciatica, has pain in his left buttock region.  Does not describe significant radicular component.  Pain is so uncomfortable it is hard for him to walk.  Past Medical History:  Diagnosis Date  . Bradycardia   . Chronic kidney disease   . Hypertension   . Nephrolithiasis   . Obesity   . Pure hypercholesterolemia, unspecified     Past Surgical History:  Procedure Laterality Date  . APPENDECTOMY    . COLONOSCOPY WITH PROPOFOL N/A 06/17/2016   Procedure: COLONOSCOPY WITH PROPOFOL;  Surgeon: Manya Silvas, MD;  Location: [facility] ENDOSCOPY;  Service: Endoscopy;  Laterality: N/A;  . lithrotripsy      Allergies Patient has no known allergies.  Review of Systems Constitutional: Negative for fever. Cardiovascular: Negative for chest pain. Respiratory: Negative for shortness of breath. Gastrointestinal: Negative for abdominal pain, vomiting and diarrhea. Musculoskeletal: Positive for back pain Skin: Negative for rash. Neurological: Negative for headaches, focal weakness or numbness.  All systems negative/normal/unremarkable except as stated in the HPI  ____________________________________________   PHYSICAL EXAM:  VITAL SIGNS: Vitals:   06/04/20 1214  BP: (!) 132/69  Pulse: 54  Resp: 18  Temp: 98.1 F (36.7 C)  SpO2: 96%    Constitutional: Alert and oriented.  Mild distress from pain Eyes: Conjunctivae are normal. Normal extraocular movements. Cardiovascular: Normal rate, regular rhythm. No murmurs, rubs, or gallops. Respiratory: Normal respiratory effort without  tachypnea nor retractions. Breath sounds are clear and equal bilaterally. No wheezes/rales/rhonchi. Gastrointestinal: Soft and nontender. Normal bowel sounds Musculoskeletal: Nontender with normal range of motion in extremities. No lower extremity tenderness nor edema.  Negative cross and straight leg raise examination Neurologic:  Normal speech and language. No gross focal neurologic deficits are appreciated.  Skin:  Skin is warm, dry and intact. No rash noted. Psychiatric: Speech and behavior are normal.  ____________________________________________   LABS (pertinent positives/negatives)  Labs Reviewed  URINALYSIS, COMPLETE (UACMP) WITH MICROSCOPIC - Abnormal; Notable for the following components:      Result Value   Color, Urine YELLOW (*)    APPearance CLEAR (*)    Hgb urine dipstick MODERATE (*)    All other components within normal limits  BASIC METABOLIC PANEL - Abnormal; Notable for the following components:   Glucose, Bld 106 (*)    BUN 34 (*)    All other components within normal limits  CBC    RADIOLOGY  Images were viewed by me CT renal protocol IMPRESSION: BILATERAL renal cysts, largest 10.2 cm diameter on LEFT.  BILATERAL nonobstructing renal calculi.  Tiny calculus dependently in urinary bladder on LEFT, potentially a passed calculus.  BILATERAL inguinal hernias containing fat LEFT, greater than RIGHT.  Mild distal colonic diverticulosis without evidence of diverticulitis.  Grade 1 spondylolisthesis L5-S1 secondary to BILATERAL spondylolysis L5.  Aortic Atherosclerosis (ICD10-I70.0).  DIFFERENTIAL DIAGNOSIS  Muscle strain, spasm, sciatic nerve pain, chronic back pain, renal colic, UTI, pyelonephritis  ASSESSMENT AND PLAN  Low back pain   Plan: The patient had presented for left-sided low back pain. Patient's  labs were unremarkable.  He was given Dilaudid, Toradol and Decadron.  CT imaging revealed very large bilateral renal cysts, largest is  10 cm in the left.  He does have a tiny calculus in his bladder likely recently passed stone, also has inguinal hernias and degenerative disc disease in his back.  Back pain is likely combination of these issues.  He be discharged with pain medicine and is cleared for outpatient follow-up with his doctor.  Lenise Arena MD    Note: This note was generated in part or whole with voice recognition software. Voice recognition is usually quite accurate but there are transcription errors that can and very often do occur. I apologize for any typographical errors that were not detected and corrected.     Earleen Newport, MD 06/04/20 [phone]

## 2020-06-12 ENCOUNTER — Other Ambulatory Visit: Payer: Self-pay | Admitting: Nurse Practitioner

## 2020-06-12 DIAGNOSIS — G8929 Other chronic pain: Secondary | ICD-10-CM

## 2020-06-13 ENCOUNTER — Other Ambulatory Visit: Payer: Self-pay

## 2020-06-13 ENCOUNTER — Ambulatory Visit
Admission: RE | Admit: 2020-06-13 | Discharge: 2020-06-13 | Disposition: A | Discharge status: Home / Self Care | Payer: Medicare Other | Source: Ambulatory Visit | Attending: Nurse Practitioner | Admitting: Nurse Practitioner

## 2020-06-13 DIAGNOSIS — M5442 Lumbago with sciatica, left side: Secondary | ICD-10-CM | POA: Diagnosis present

## 2020-06-13 DIAGNOSIS — G8929 Other chronic pain: Secondary | ICD-10-CM | POA: Insufficient documentation

## 2020-06-14 ENCOUNTER — Ambulatory Visit (INDEPENDENT_AMBULATORY_CARE_PROVIDER_SITE_OTHER): Payer: Medicare Other | Admitting: Urology

## 2020-06-14 ENCOUNTER — Encounter: Payer: Self-pay | Admitting: Urology

## 2020-06-14 VITALS — BP 109/63 | HR 65 | Ht 71.0 in | Wt 270.0 lb

## 2020-06-14 DIAGNOSIS — N281 Cyst of kidney, acquired: Secondary | ICD-10-CM

## 2020-06-14 DIAGNOSIS — N200 Calculus of kidney: Secondary | ICD-10-CM | POA: Diagnosis not present

## 2020-06-15 ENCOUNTER — Encounter: Payer: Self-pay | Admitting: Urology

## 2020-06-15 DIAGNOSIS — N200 Calculus of kidney: Secondary | ICD-10-CM | POA: Insufficient documentation

## 2020-06-15 DIAGNOSIS — N281 Cyst of kidney, acquired: Secondary | ICD-10-CM | POA: Insufficient documentation

## 2020-06-15 NOTE — Progress Notes (Signed)
06/14/2020 12:01 PM   Louis Jackson [birth year] [id number]  Referring provider: Derinda Late, MD [phone] [place],  [place] [address]  Chief Complaint  Patient presents with  . New Patient (Initial Visit)    cyst on kidney    HPI: Louis Jackson is a 64 y.o. male seen at the request of Dr. Baldemar Lenis for evaluation of renal cysts.   Presented to [facility] ED 06/04/2020 complaining of left flank pain  Prior history of recurrent stone disease  Also has history of this disease and left-sided sciatica  Stone protocol CT was performed which showed a small calculus in the dependent portion of the left bladder felt consistent with a recently passed stone.  There are also bilateral, nonobstructing renal calculi  Bilateral renal cysts incidentally noted largest on the left measuring 10 cm  No family history of renal cyst or adult polycystic kidney disease  Renal function was normal  On chart review renal cyst noted dating back to 2015 with the largest left sided cyst measuring 8.8 cm   PMH: Past Medical History:  Diagnosis Date  . Bradycardia   . Chronic kidney disease   . Hypertension   . Nephrolithiasis   . Obesity   . Pure hypercholesterolemia, unspecified     Surgical History: Past Surgical History:  Procedure Laterality Date  . APPENDECTOMY    . COLONOSCOPY WITH PROPOFOL N/A 06/17/2016   Procedure: COLONOSCOPY WITH PROPOFOL;  Surgeon: Manya Silvas, MD;  Location: [facility] ENDOSCOPY;  Service: Endoscopy;  Laterality: N/A;  . lithrotripsy      Home Medications:  Allergies as of 06/14/2020   No Known Allergies     Medication List       Accurate as of June 14, 2020 11:59 PM. If you have any questions, ask your nurse or doctor.        aspirin 81 MG tablet Take 81 mg by mouth daily.   colchicine 0.6 MG tablet Take by mouth.   HYDROcodone-acetaminophen 5-325 MG tablet Commonly known as:  NORCO/VICODIN Take 1 tablet by mouth every 6 (six) hours as needed for moderate pain.   ibuprofen 600 MG tablet Commonly known as: ADVIL Take 1 tablet (600 mg total) by mouth every 6 (six) hours as needed.   olmesartan-hydrochlorothiazide 20-12.5 MG tablet Commonly known as: BENICAR HCT Take 1 tablet by mouth daily.   ondansetron 4 MG disintegrating tablet Commonly known as: Zofran ODT Take 1 tablet (4 mg total) by mouth every 8 (eight) hours as needed for nausea or vomiting.   pravastatin 20 MG tablet Commonly known as: PRAVACHOL Take by mouth.   predniSONE 10 MG (21) Tbpk tablet Commonly known as: STERAPRED UNI-PAK 21 TAB Dispense steroid taper pack as directed   rosuvastatin 10 MG tablet Commonly known as: CRESTOR Take 10 mg by mouth daily.   sildenafil 100 MG tablet Commonly known as: VIAGRA Take by mouth.       Allergies: No Known Allergies  Family History: History reviewed. No pertinent family history.  Social History:  reports that he has never smoked. He has quit using smokeless tobacco. He reports current alcohol use of about 14.0 standard drinks of alcohol per week. He reports that he does not use drugs.   Physical Exam: BP 109/63   Pulse 65   Ht 5\' 11"  (1.803 m)   Wt 270 lb (122.5 kg)   BMI 37.66 kg/m   Constitutional:  Alert and oriented, No  acute distress. HEENT: [place] AT, moist mucus membranes.  Trachea midline, no masses. Cardiovascular: No clubbing, cyanosis, or edema. Respiratory: Normal respiratory effort, no increased work of breathing. GI: Abdomen is soft, nontender, nondistended, no abdominal masses GU: No CVA tenderness Lymph: No cervical or inguinal lymphadenopathy. Skin: No rashes, bruises or suspicious lesions. Neurologic: Grossly intact, no focal deficits, moving all 4 extremities. Psychiatric: Normal mood and affect.  Laboratory Data:   Lab Results  Component Value Date   CREATININE 1.11 06/04/2020     Pertinent  Imaging: Images were personally reviewed  CT Renal Stone Study  Narrative CLINICAL DATA:  Hematuria of unknown cause, LEFT flank pain beginning this morning, history of numerous kidney stones, slow urine flow today  EXAM: CT ABDOMEN AND PELVIS WITHOUT CONTRAST  TECHNIQUE: Multidetector CT imaging of the abdomen and pelvis was performed following the standard protocol without IV contrast. Sagittal and coronal MPR images reconstructed from axial data set.  COMPARISON:  04/11/2017  FINDINGS: Lower chest: Lung bases clear  Hepatobiliary: Calcified granuloma medial RIGHT lobe liver. Small cyst superiorly LEFT lobe liver 11 x 8 mm image 14. Gallbladder and liver otherwise normal.  Pancreas: Normal appearance  Spleen: Normal appearance  Adrenals/Urinary Tract: Adrenal glands normal appearance. BILATERAL renal cysts, largest anterior inferior LEFT kidney 10.2 x 9.2 x 9.3 cm. Single small nonobstructing calculus at inferior RIGHT kidney. 11 x 7 mm calculus posterior upper to mid LEFT kidney. No hydronephrosis, hydroureter, or ureteral calcification. Tiny calculus dependently in urinary bladder on LEFT potentially a passed calculus. Bladder otherwise unremarkable.  Stomach/Bowel: Appendix surgically absent by history. Scattered stool throughout colon. Mild distal colonic diverticulosis without evidence of diverticulitis. Stomach and bowel loops otherwise normal appearance.  Vascular/Lymphatic: Minimal atherosclerotic calcifications of aorta, iliac arteries, renal arteries. Aorta normal caliber. Calcified pelvic phleboliths. No adenopathy.  Reproductive: Prostatic calcifications, prostate gland 4.8 x 3.3 cm.  Other: BILATERAL inguinal hernias containing fat LEFT greater than RIGHT. Surgical clips in the superior scrotum bilaterally question vasectomy. No free air or free fluid. Scattered stranding of small-bowel mesentery question fibrosing mesenteritis,  unchanged.  Musculoskeletal: Grade 1 spondylolisthesis L5-S1 secondary to BILATERAL spondylolysis L5. No acute osseous findings.  IMPRESSION: BILATERAL renal cysts, largest 10.2 cm diameter on LEFT.  BILATERAL nonobstructing renal calculi.  Tiny calculus dependently in urinary bladder on LEFT, potentially a passed calculus.  BILATERAL inguinal hernias containing fat LEFT, greater than RIGHT.  Mild distal colonic diverticulosis without evidence of diverticulitis.  Grade 1 spondylolisthesis L5-S1 secondary to BILATERAL spondylolysis L5.  Aortic Atherosclerosis (ICD10-I70.0).   Electronically Signed By: Lavonia Dana M.D. On: 06/04/2020 16:54   Assessment & Plan:    1.  Renal cysts  Renal cysts are simple and longstanding  Normal renal function  No further evaluation or surveillance needed  2.  Nephrolithiasis  Nonobstructing renal calculi  Recently passed ureteral calculus  Follow-up prn stone pain    Abbie Sons, MD  [facility] [address], [place], [place] [address] [phone]

## 2020-06-21 DIAGNOSIS — M5442 Lumbago with sciatica, left side: Secondary | ICD-10-CM | POA: Insufficient documentation

## 2020-06-21 DIAGNOSIS — M48061 Spinal stenosis, lumbar region without neurogenic claudication: Secondary | ICD-10-CM | POA: Insufficient documentation

## 2021-01-03 ENCOUNTER — Ambulatory Visit (INDEPENDENT_AMBULATORY_CARE_PROVIDER_SITE_OTHER): Payer: Medicare Other

## 2021-01-03 ENCOUNTER — Ambulatory Visit (INDEPENDENT_AMBULATORY_CARE_PROVIDER_SITE_OTHER): Payer: Medicare Other | Admitting: Podiatry

## 2021-01-03 ENCOUNTER — Other Ambulatory Visit: Payer: Self-pay

## 2021-01-03 DIAGNOSIS — M722 Plantar fascial fibromatosis: Secondary | ICD-10-CM

## 2021-01-03 MED ORDER — METHYLPREDNISOLONE 4 MG PO TBPK: ORAL_TABLET | ORAL | 0 refills | Status: DC

## 2021-01-03 MED ORDER — BETAMETHASONE SOD PHOS & ACET 6 (3-3) MG/ML IJ SUSP: 3.0000 mg | Freq: Once | INTRAMUSCULAR | Status: DC

## 2021-01-03 NOTE — Progress Notes (Signed)
   Subjective: 65 y.o. male presenting as a new patient for evaluation of bilateral heel pain is been going on for several years now.  Patient states that in the past he has worn custom orthotics.  He currently wears Hoka's and over-the-counter super feet insoles with minimal relief.  Patient constantly has pain in his bilateral heels despite multiple conservative modalities.  He presents for further treatment and evaluation   Past Medical History:  Diagnosis Date  . Bradycardia   . Chronic kidney disease   . Hypertension   . Nephrolithiasis   . Obesity   . Pure hypercholesterolemia, unspecified      Objective: Physical Exam General: The patient is alert and oriented x3 in no acute distress.  Dermatology: Skin is warm, dry and supple bilateral lower extremities. Negative for open lesions or macerations bilateral.   Vascular: Dorsalis Pedis and Posterior Tibial pulses palpable bilateral.  Capillary fill time is immediate to all digits.  Neurological: Epicritic and protective threshold intact bilateral.   Musculoskeletal: Tenderness to palpation to the plantar aspect of the bilateral heels along the plantar fascia. All other joints range of motion within normal limits bilateral. Strength 5/5 in all groups bilateral.   Radiographic exam: Normal osseous mineralization. Joint spaces preserved. No fracture/dislocation/boney destruction. No other soft tissue abnormalities or radiopaque foreign bodies.   Assessment: 1. plantar fasciitis bilateral feet  Plan of Care:  1. Patient evaluated. Xrays reviewed.   2. Injection of 0.5cc Celestone soluspan injected into the bilateral heels.  3. Rx for Medrol Dose Pak placed 4.  No NSAIDs prescribed today.  Patient has history of chronic kidney stones 5.  Continue Hoka shoes with OTC arch supports from [employer] running store 6. Instructed patient regarding therapies and modalities at home to alleviate symptoms.  7. 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 Edrick Kins, DPM    [address], [place] [address]                Office [phone]  Fax [phone]

## 2021-02-06 ENCOUNTER — Other Ambulatory Visit: Payer: Self-pay

## 2021-02-06 ENCOUNTER — Ambulatory Visit (INDEPENDENT_AMBULATORY_CARE_PROVIDER_SITE_OTHER): Payer: Medicare Other | Admitting: Podiatry

## 2021-02-06 DIAGNOSIS — M722 Plantar fascial fibromatosis: Secondary | ICD-10-CM

## 2021-02-06 NOTE — Progress Notes (Signed)
   Subjective: 65 y.o. male presenting today for follow-up evaluation of bilateral plantar fasciitis.  Patient states that in the past he has worn custom orthotics.  He currently wears Hoka's and over-the-counter super feet insoles with minimal improvement in the past.  He states that the prednisone pack and injections helped significantly.  He currently has no pain to his bilateral feet.  He is walking and ambulating just fine with no complications.   Past Medical History:  Diagnosis Date  . Bradycardia   . Chronic kidney disease   . Hypertension   . Nephrolithiasis   . Obesity   . Pure hypercholesterolemia, unspecified      Objective: Physical Exam General: The patient is alert and oriented x3 in no acute distress.  Dermatology: Skin is warm, dry and supple bilateral lower extremities. Negative for open lesions or macerations bilateral.   Vascular: Dorsalis Pedis and Posterior Tibial pulses palpable bilateral.  Capillary fill time is immediate to all digits.  Neurological: Epicritic and protective threshold intact bilateral.   Musculoskeletal: Tenderness to palpation to the plantar aspect of the bilateral heels along the plantar fascia. All other joints range of motion within normal limits bilateral. Strength 5/5 in all groups bilateral.   Radiographic exam: Normal osseous mineralization. Joint spaces preserved. No fracture/dislocation/boney destruction. No other soft tissue abnormalities or radiopaque foreign bodies.   Assessment: 1. plantar fasciitis bilateral feet  Plan of Care:  1. Patient evaluated.   2.  Patient cannot tolerate oral NSAIDs.  Patient has history of chronic kidney stones 3.  Continue Hoka shoes with OTC arch supports from [employer] running store 4.  Return to clinic as needed.  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 Edrick Kins, DPM    [address], [place] [address]                Office [phone]  Fax [phone]

## 2022-01-22 ENCOUNTER — Ambulatory Visit (INDEPENDENT_AMBULATORY_CARE_PROVIDER_SITE_OTHER): Payer: Medicare Other | Admitting: Podiatry

## 2022-01-22 ENCOUNTER — Ambulatory Visit: Payer: Medicare Other

## 2022-01-22 ENCOUNTER — Other Ambulatory Visit: Payer: Self-pay

## 2022-01-22 ENCOUNTER — Ambulatory Visit (INDEPENDENT_AMBULATORY_CARE_PROVIDER_SITE_OTHER): Payer: Medicare Other

## 2022-01-22 DIAGNOSIS — M2011 Hallux valgus (acquired), right foot: Secondary | ICD-10-CM | POA: Diagnosis not present

## 2022-01-22 DIAGNOSIS — M722 Plantar fascial fibromatosis: Secondary | ICD-10-CM

## 2022-01-22 DIAGNOSIS — M2012 Hallux valgus (acquired), left foot: Secondary | ICD-10-CM | POA: Diagnosis not present

## 2022-01-22 IMAGING — DX DG TIBIA/FIBULA 2V*L*
4 series · 4 of 4 positions shown · non-contrast
Comparison: None

CLINICAL DATA: LEFT lower leg pain, LEFT calf pain for 5 days
worsened over the week, drove home from [place] [REDACTED], no
history of trauma

EXAM:
LEFT TIBIA AND FIBULA - 2 VIEW

[tibia ap (1 of 2)]
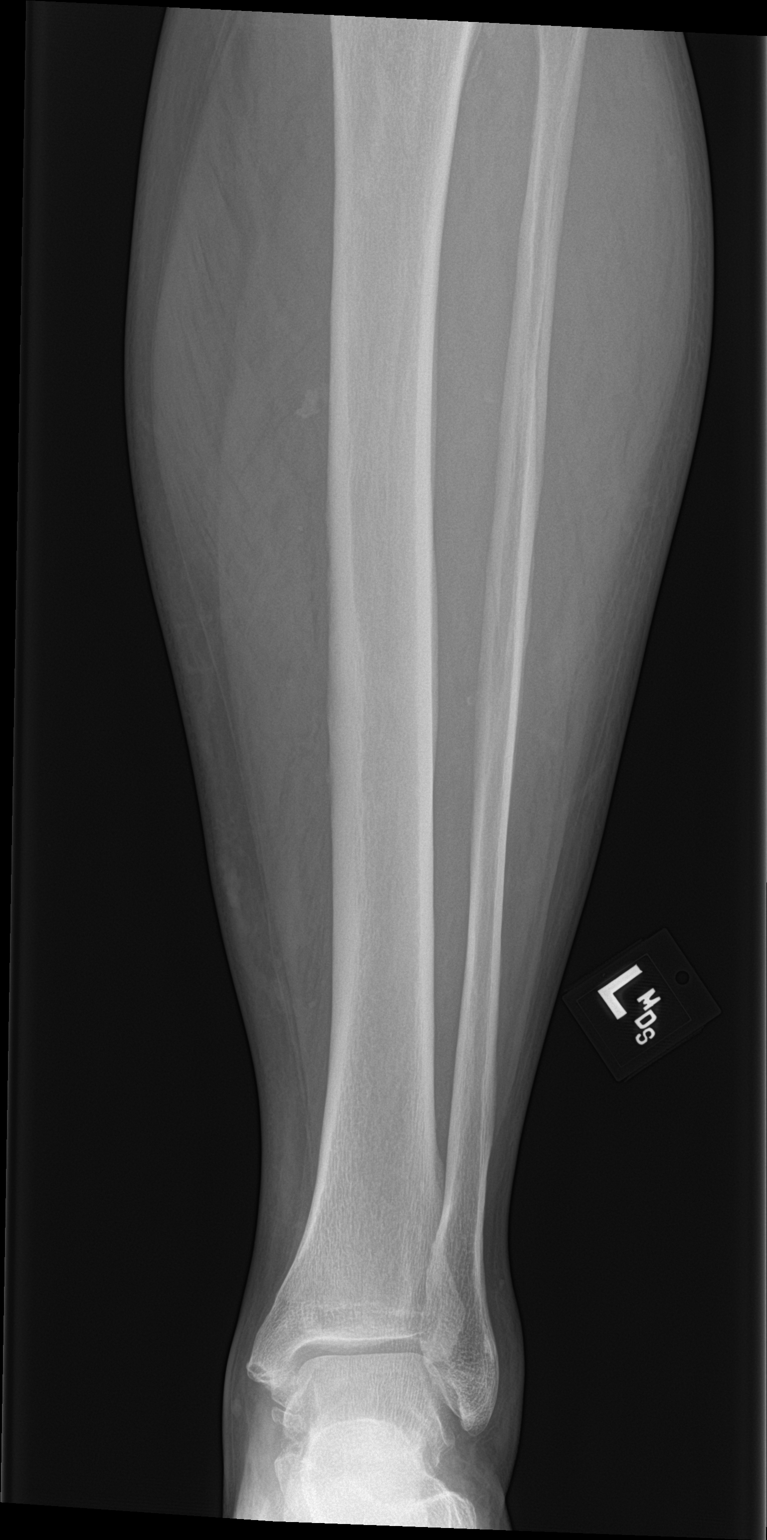

[tibia ap (2 of 2)]
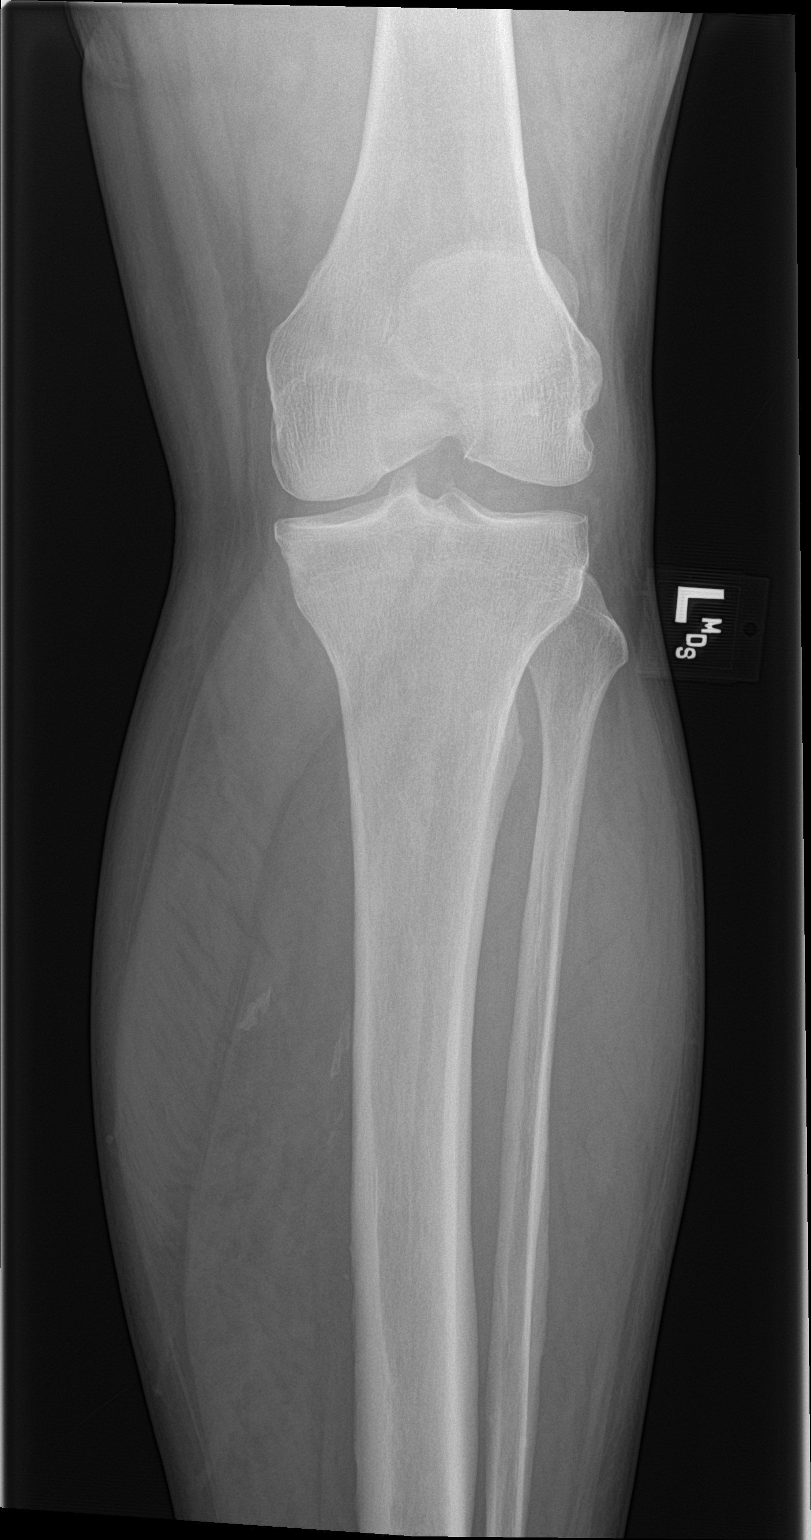

[tibia lat (1 of 2)]
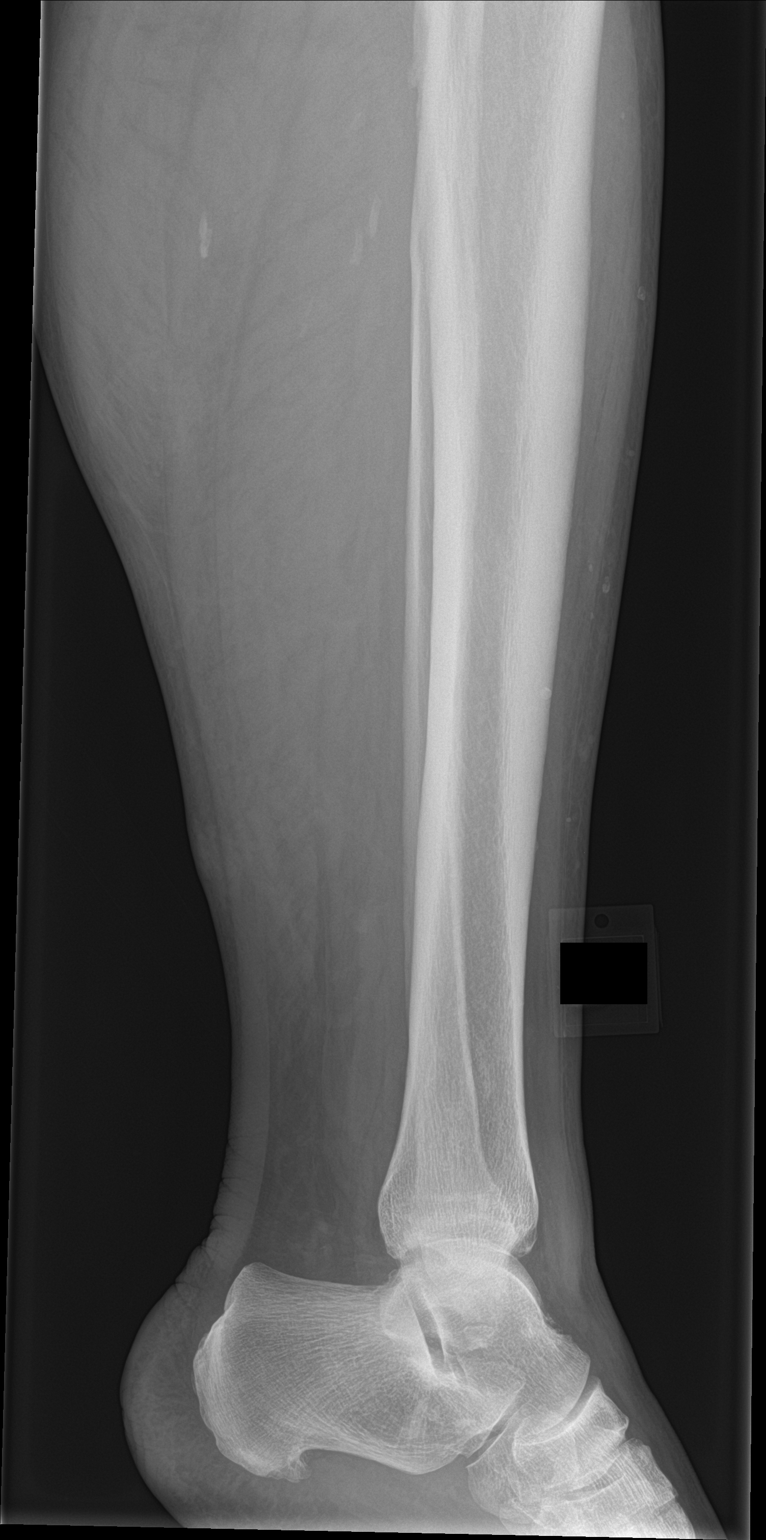

[tibia lat (2 of 2)]
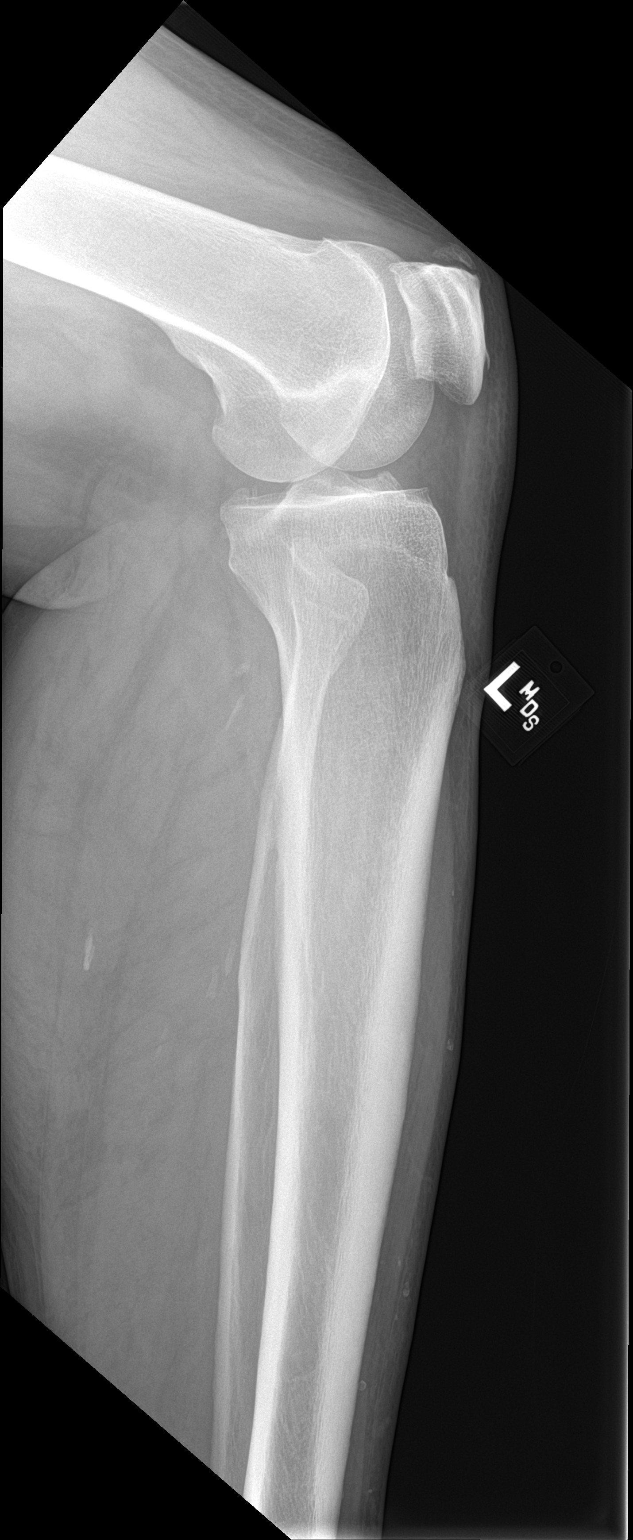

[4 of 4 positions shown; findings below may reference images not displayed]

FINDINGS: Osseous mineralization normal.

Old appearing ossicle at tip of medial malleolus.

Knee and ankle joint spaces preserved.

No fracture, dislocation, or bone destruction.

Small plantar calcaneal spur.

Minimal nonspecific calcification within muscular planes in the
calf.
IMPRESSION: No acute osseous abnormalities.

## 2022-01-22 MED ORDER — BETAMETHASONE SOD PHOS & ACET 6 (3-3) MG/ML IJ SUSP
3.0000 mg | Freq: Once | INTRAMUSCULAR | Status: AC
  Administered 2022-01-22: 3 mg via INTRA_ARTICULAR

## 2022-01-22 NOTE — Progress Notes (Signed)
? ?  Subjective: ?66 y.o. male presenting today for follow-up evaluation of bilateral plantar fasciitis.  Patient states that in the past he has worn custom orthotics.  He currently wears Hoka's and over-the-counter super feet insoles with minimal improvement in the past.  Patient states that the injections last visit helped for about 7-9 months.  ? ?Patient also states that occasionally he does have pain associated to his bunion deformities.  He would like to discuss the bunion deformity and different treatment options as well. ? ? ?Past Medical History:  ?Diagnosis Date  ? Bradycardia   ? Chronic kidney disease   ? Hypertension   ? Nephrolithiasis   ? Obesity   ? Pure hypercholesterolemia, unspecified   ? ?Past Surgical History:  ?Procedure Laterality Date  ? APPENDECTOMY    ? COLONOSCOPY WITH PROPOFOL N/A 06/17/2016  ? Procedure: COLONOSCOPY WITH PROPOFOL;  Surgeon: Manya Silvas, MD;  Location: [facility] ENDOSCOPY;  Service: Endoscopy;  Laterality: N/A;  ? lithrotripsy    ? ?No Known Allergies ? ? ?Objective: ?Physical Exam ?General: The patient is alert and oriented x3 in no acute distress. ? ?Dermatology: Skin is warm, dry and supple bilateral lower extremities. Negative for open lesions or macerations bilateral.  ? ?Vascular: Dorsalis Pedis and Posterior Tibial pulses palpable bilateral.  Capillary fill time is immediate to all digits. ? ?Neurological: Epicritic and protective threshold intact bilateral.  ? ?Musculoskeletal: Tenderness to palpation to the plantar aspect of the bilateral heels along the plantar fascia. All other joints range of motion within normal limits bilateral. Strength 5/5 in all groups bilateral.  ? ?Radiographic exam: ?Normal osseous mineralization. Joint spaces preserved. No fracture/dislocation/boney destruction. No other soft tissue abnormalities or radiopaque foreign bodies.  Increased intermetatarsal angle with a hallux abductus angle also noted consistent with hallux valgus  bilateral ? ?Assessment: ?1. plantar fasciitis bilateral feet ?2.  Hallux valgus bilateral ? ?Plan of Care:  ?1. Patient evaluated.   ?2.  Patient cannot tolerate oral NSAIDs.  Patient has history of chronic kidney stones ?3.  Continue Hoka shoes with OTC arch supports from [employer] running store ?4.  Injection of 0.5 cc Celestone Soluspan injected in the bilateral plantar fascia  ?5.  Appointment with Pedorthist for custom molded orthotics  ?6.  In regards to the hallux valgus to the bilateral feet, we did discuss both surgical and conservative treatment options.  For now we are going to pursue conservative treatment options including good supportive shoes and sneakers  ?7.  Patient also states that he takes colchicine 0.6 mg daily.  Continue as per prescribing physician  ?8.  Return to clinic as needed. ? ?*Retired.  Avid golfer 3-4x/week.  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 ?Edrick Kins, DPM ?[place] ? ?Dr. Edrick Kins, DPM  ?  ?[address]                                   ?[place], [place] [address]                ?Office [phone]  ?Fax [phone] ? ? ? ? ?

## 2022-01-22 NOTE — Progress Notes (Signed)
SITUATION ?Reason for Consult: Evaluation for Bilateral Custom Foot Orthoses ?Patient / Caregiver Report: Patient is ready for foot orthotics ? ?OBJECTIVE DATA: ?Patient History / Diagnosis:  ?  ICD-10-CM   ?1. Plantar fasciitis, right  M72.2   ?  ?2. Hallux valgus, bilateral  M20.11   ? M20.12   ?  ?3. Plantar fasciitis, left  M72.2   ?  ? ? ?Current or Previous Devices: Prefabricated self purchased ? ?Foot Examination: ?Skin presentation:   Intact ?Ulcers & Callousing:   None and no history ?Toe / Foot Deformities:  Hallux valgus ?Weight Bearing Presentation:  Rectus ?Sensation:    Intact ? ?ORTHOTIC RECOMMENDATION ?Recommended Device: 1x pair of custom functional foot orthotics ? ?GOALS OF ORTHOSES ?- Reduce Pain ?- Prevent Foot Deformity ?- Prevent Progression of Further Foot Deformity ?- Relieve Pressure ?- Improve the Overall Biomechanical Function of the Foot and Lower Extremity. ? ?ACTIONS PERFORMED ?Patient was casted for Foot Orthoses via crush box. Procedure was explained and patient tolerated procedure well. All questions were answered and concerns addressed. ? ?PLAN ?Potential out of pocket cost was communicated to patient. Casts are to be sent to [facility] for fabrication. Patient is to be called for fitting when devices are ready.  ? ? ?

## 2022-02-07 ENCOUNTER — Ambulatory Visit (INDEPENDENT_AMBULATORY_CARE_PROVIDER_SITE_OTHER): Payer: Medicare Other | Admitting: Dermatology

## 2022-02-07 DIAGNOSIS — L719 Rosacea, unspecified: Secondary | ICD-10-CM | POA: Diagnosis not present

## 2022-02-07 DIAGNOSIS — Z1283 Encounter for screening for malignant neoplasm of skin: Secondary | ICD-10-CM | POA: Diagnosis not present

## 2022-02-07 DIAGNOSIS — L570 Actinic keratosis: Secondary | ICD-10-CM

## 2022-02-07 DIAGNOSIS — L918 Other hypertrophic disorders of the skin: Secondary | ICD-10-CM

## 2022-02-07 DIAGNOSIS — L578 Other skin changes due to chronic exposure to nonionizing radiation: Secondary | ICD-10-CM | POA: Diagnosis not present

## 2022-02-07 DIAGNOSIS — D229 Melanocytic nevi, unspecified: Secondary | ICD-10-CM

## 2022-02-07 DIAGNOSIS — D1800 Hemangioma unspecified site: Secondary | ICD-10-CM

## 2022-02-07 DIAGNOSIS — L814 Other melanin hyperpigmentation: Secondary | ICD-10-CM

## 2022-02-07 DIAGNOSIS — L821 Other seborrheic keratosis: Secondary | ICD-10-CM

## 2022-02-07 DIAGNOSIS — D692 Other nonthrombocytopenic purpura: Secondary | ICD-10-CM

## 2022-02-07 NOTE — Progress Notes (Signed)
? ?New Patient Visit ? ?Subjective  ?Louis Jackson is a 66 y.o. male who presents for the following: Annual Exam (New patient - no history of abnormal moles or skin cancers - TBSE today). ?The patient presents for Total-Body Skin Exam (TBSE) for skin cancer screening and mole check.  The patient has spots, moles and lesions to be evaluated, some may be new or changing and the patient has concerns that these could be cancer. ? ?The following portions of the chart were reviewed this encounter and updated as appropriate:  ? Tobacco  Allergies  Meds  Problems  Med Hx  Surg Hx  Fam Hx   ?  ?Review of Systems:  No other skin or systemic complaints except as noted in HPI or Assessment and Plan. ? ?Objective  ?Well appearing patient in no apparent distress; mood and affect are within normal limits. ? ?A full examination was performed including scalp, head, eyes, ears, nose, lips, neck, chest, axillae, abdomen, back, buttocks, bilateral upper extremities, bilateral lower extremities, hands, feet, fingers, toes, fingernails, and toenails. All findings within normal limits unless otherwise noted below. ? ?Scalp x 8, left infraorbital x 1 (9) ?Erythematous thin papules/macules with gritty scale.  ? ?Head - Anterior (Face) ?Erythema and dilated blood vessels ? ? ?Assessment & Plan  ? ?Purpura - Chronic; persistent and recurrent.  Treatable, but not curable. ?- Violaceous macules and patches ?- Benign ?- Related to trauma, age, sun damage and/or use of blood thinners, chronic use of topical and/or oral steroids ?- Observe ?- Can use OTC arnica containing moisturizer such as Dermend Bruise Formula if desired ?- Call for worsening or other concerns ? ?Acrochordons (Skin Tags) ?- Fleshy, skin-colored pedunculated papules ?- Benign appearing.  ?- Observe. ?- If desired, they can be removed with an in office procedure that is not covered by insurance. ?- Please call the clinic if you notice any new or changing  lesions. ? ?Lentigines ?- Scattered tan macules ?- Due to sun exposure ?- Benign-appearing, observe ?- Recommend daily broad spectrum sunscreen SPF 30+ to sun-exposed areas, reapply every 2 hours as needed. ?- Call for any changes ? ?Seborrheic Keratoses ?- Stuck-on, waxy, tan-brown papules and/or plaques  ?- Benign-appearing ?- Discussed benign etiology and prognosis. ?- Observe ?- Call for any changes ? ?Melanocytic Nevi ?- Tan-brown and/or pink-flesh-colored symmetric macules and papules ?- Benign appearing on exam today ?- Observation ?- Call clinic for new or changing moles ?- Recommend daily use of broad spectrum spf 30+ sunscreen to sun-exposed areas.  ? ?Hemangiomas ?- Red papules ?- Discussed benign nature ?- Observe ?- Call for any changes ? ?Actinic Damage ?- Chronic condition, secondary to cumulative UV/sun exposure ?- diffuse scaly erythematous macules with underlying dyspigmentation ?- Recommend daily broad spectrum sunscreen SPF 30+ to sun-exposed areas, reapply every 2 hours as needed.  ?- Staying in the shade or wearing long sleeves, sun glasses (UVA+UVB protection) and wide brim hats (4-inch brim around the entire circumference of the hat) are also recommended for sun protection.  ?- Call for new or changing lesions. ? ?Skin cancer screening performed today. ? ?AK (actinic keratosis) (9) ?Scalp x 8, left infraorbital x 1 ?Destruction of lesion - Scalp x 8, left infraorbital x 1 ?Complexity: simple   ?Destruction method: cryotherapy   ?Informed consent: discussed and consent obtained   ?Timeout:  patient name, date of birth, surgical site, and procedure verified ?Lesion destroyed using liquid nitrogen: Yes   ?Region frozen until ice ball extended  beyond lesion: Yes   ?Outcome: patient tolerated procedure well with no complications   ?Post-procedure details: wound care instructions given   ? ?Rosacea ?Head - Anterior (Face) ?Rosacea is a chronic progressive skin condition usually affecting the face  of adults, causing redness and/or acne bumps. It is treatable but not curable. It sometimes affects the eyes (ocular rosacea) as well. It may respond to topical and/or systemic medication and can flare with stress, sun exposure, alcohol, exercise and some foods.  Daily application of broad spectrum spf 30+ sunscreen to face is recommended to reduce flares. ? ?Patient declines treatment at this time. ? ?Skin cancer screening ? ?Return for 4-6 month , AK follow up. ? ?I, Ashok Cordia, CMA, am acting as scribe for Sarina Ser, MD . ?Documentation: I have reviewed the above documentation for accuracy and completeness, and I agree with the above. ? ?Sarina Ser, MD ? ? ?

## 2022-02-07 NOTE — Patient Instructions (Signed)

## 2022-02-08 ENCOUNTER — Encounter: Payer: Self-pay | Admitting: Dermatology

## 2022-02-18 ENCOUNTER — Ambulatory Visit (INDEPENDENT_AMBULATORY_CARE_PROVIDER_SITE_OTHER): Payer: Medicare Other

## 2022-02-18 DIAGNOSIS — M2011 Hallux valgus (acquired), right foot: Secondary | ICD-10-CM

## 2022-02-18 DIAGNOSIS — M722 Plantar fascial fibromatosis: Secondary | ICD-10-CM

## 2022-02-18 NOTE — Progress Notes (Signed)
SITUATION: ?Reason for Visit: Fitting and Delivery of Custom Fabricated Foot Orthoses ?Patient Report: Patient reports comfort and is satisfied with device. ? ?OBJECTIVE DATA: ?Patient History / Diagnosis:   ?  ICD-10-CM   ?1. Plantar fasciitis, right  M72.2   ?  ?2. Hallux valgus, bilateral  M20.11   ? M20.12   ?  ?3. Plantar fasciitis, left  M72.2   ?  ? ? ?Provided Device:  Custom Functional Foot Orthotics ?    [employer]: [id number] ? ?GOAL OF ORTHOSIS ?- Improve gait ?- Decrease energy expenditure ?- Improve Balance ?- Provide Triplanar stability of foot complex ?- Facilitate motion ? ?ACTIONS PERFORMED ?Patient was fit with foot orthotics trimmed to shoe last. Patient tolerated fittign procedure.  ? ?Patient was provided with verbal and written instruction and demonstration regarding donning, doffing, wear, care, proper fit, function, purpose, cleaning, and use of the orthosis and in all related precautions and risks and benefits regarding the orthosis. ? ?Patient was also provided with verbal instruction regarding how to report any failures or malfunctions of the orthosis and necessary follow up care. Patient was also instructed to contact our office regarding any change in status that may affect the function of the orthosis. ? ?Patient demonstrated independence with proper donning, doffing, and fit and verbalized understanding of all instructions. ? ?PLAN: ?Patient is to follow up in one week or as necessary (PRN). All questions were answered and concerns addressed. Plan of care was discussed with and agreed upon by the patient. ? ?

## 2022-04-30 ENCOUNTER — Ambulatory Visit (INDEPENDENT_AMBULATORY_CARE_PROVIDER_SITE_OTHER): Payer: Medicare Other | Admitting: Podiatry

## 2022-04-30 ENCOUNTER — Ambulatory Visit (INDEPENDENT_AMBULATORY_CARE_PROVIDER_SITE_OTHER): Payer: Medicare Other

## 2022-04-30 DIAGNOSIS — M722 Plantar fascial fibromatosis: Secondary | ICD-10-CM | POA: Diagnosis not present

## 2022-04-30 DIAGNOSIS — M7661 Achilles tendinitis, right leg: Secondary | ICD-10-CM

## 2022-04-30 MED ORDER — METHYLPREDNISOLONE 4 MG PO TBPK: ORAL_TABLET | ORAL | 0 refills | Status: DC

## 2022-04-30 MED ORDER — BETAMETHASONE SOD PHOS & ACET 6 (3-3) MG/ML IJ SUSP
3.0000 mg | Freq: Once | INTRAMUSCULAR | Status: AC
  Administered 2022-04-30: 3 mg via INTRA_ARTICULAR

## 2022-04-30 NOTE — Progress Notes (Signed)
   Subjective: 66 y.o. male presenting today for new complaint regarding pain and tenderness to the Achilles tendon of the right lower extremity.  Patient states that he began to notice right posterior ankle pain about 2-3 weeks ago.  He denies a history of injury.  He is an avid Air cabin crew.  He states the pain slowly increased.  He currently has to use a crutch intermittently to help with the pain and ambulation.  Past Medical History:  Diagnosis Date   Bradycardia    Chronic kidney disease    Hypertension    Nephrolithiasis    Obesity    Pure hypercholesterolemia, unspecified    Past Surgical History:  Procedure Laterality Date   APPENDECTOMY     COLONOSCOPY WITH PROPOFOL N/A 06/17/2016   Procedure: COLONOSCOPY WITH PROPOFOL;  Surgeon: Manya Silvas, MD;  Location: [facility];  Service: Endoscopy;  Laterality: N/A;   lithrotripsy     No Known Allergies   Objective: Physical Exam General: The patient is alert and oriented x3 in no acute distress.  Dermatology: Skin is warm, dry and supple bilateral lower extremities. Negative for open lesions or macerations bilateral.   Vascular: Dorsalis Pedis and Posterior Tibial pulses palpable bilateral.  Capillary fill time is immediate to all digits.  Neurological: Epicritic and protective threshold intact bilateral.   Musculoskeletal: The tenderness to the plantar fascia appears mostly resolved today.  There is pain on palpation along the medial portion of the Achilles tendon as it inserts onto the posterior tubercle of the calcaneus right lower extremity.  All other joints range of motion within normal limits bilateral. Strength 5/5 in all groups bilateral.   Radiographic exam RT foot 04/30/2022: Normal osseous mineralization. Joint spaces preserved. No fracture/dislocation/boney destruction. No other soft tissue abnormalities or radiopaque foreign bodies.  Increased intermetatarsal angle with a hallux abductus angle also noted consistent  with hallux valgus bilateral  Assessment: 1. plantar fasciitis bilateral feet 2.  Hallux valgus bilateral 3.  Insertional Achilles tendinitis right  Plan of Care:  1. Patient evaluated.   2.  Patient cannot tolerate oral NSAIDs.  Patient has history of chronic kidney stones 3.  Continue Hoka shoes with OTC arch supports from [employer] running store 4.  Injection of 0.5 cc Celestone Soluspan injected in the medial portion along the insertion of the Achilles tendon right lower extremity 5.  Continue wearing custom molded orthotics 6.  In regards to the hallux valgus to the bilateral feet, we did discuss both surgical and conservative treatment options.  For now we are going to pursue conservative treatment options including good supportive shoes and sneakers  7.  Prescription for Medrol Dosepak 8.  Return to clinic as needed  *Retired.  Avid golfer 3-4x/week.  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 Edrick Kins, DPM    [address], [place] [address]                Office [phone]  Fax [phone]

## 2022-08-12 ENCOUNTER — Encounter: Payer: Self-pay | Admitting: Dermatology

## 2022-08-12 ENCOUNTER — Ambulatory Visit (INDEPENDENT_AMBULATORY_CARE_PROVIDER_SITE_OTHER): Payer: Medicare Other | Admitting: Dermatology

## 2022-08-12 DIAGNOSIS — L578 Other skin changes due to chronic exposure to nonionizing radiation: Secondary | ICD-10-CM

## 2022-08-12 DIAGNOSIS — L219 Seborrheic dermatitis, unspecified: Secondary | ICD-10-CM | POA: Diagnosis not present

## 2022-08-12 DIAGNOSIS — Z79899 Other long term (current) drug therapy: Secondary | ICD-10-CM

## 2022-08-12 DIAGNOSIS — D692 Other nonthrombocytopenic purpura: Secondary | ICD-10-CM | POA: Diagnosis not present

## 2022-08-12 DIAGNOSIS — L570 Actinic keratosis: Secondary | ICD-10-CM | POA: Diagnosis not present

## 2022-08-12 MED ORDER — HYDROCORTISONE 2.5 % EX CREA: TOPICAL_CREAM | CUTANEOUS | 2 refills | Status: AC

## 2022-08-12 MED ORDER — KETOCONAZOLE 2 % EX CREA: TOPICAL_CREAM | CUTANEOUS | 2 refills | Status: AC

## 2022-08-12 NOTE — Patient Instructions (Addendum)
Cryotherapy Aftercare  Wash gently with soap and water everyday.   Apply Vaseline daily until healed.    Start Ketoconazole 2% cream M, W, F at bedtime to face as needed. Start Hydrocortisone 2.5% cream  Tue., Thur., Sat. at bedtime as needed only.  Topical steroids (such as triamcinolone, fluocinolone, fluocinonide, mometasone, clobetasol, halobetasol, betamethasone, hydrocortisone) can cause thinning and lightening of the skin if they are used for too long in the same area. Your physician has selected the right strength medicine for your problem and area affected on the body. Please use your medication only as directed by your physician to prevent side effects.     Recommend daily broad spectrum sunscreen SPF 30+ to sun-exposed areas, reapply every 2 hours as needed. Call for new or changing lesions.  Staying in the shade or wearing long sleeves, sun glasses (UVA+UVB protection) and wide brim hats (4-inch brim around the entire circumference of the hat) are also recommended for sun protection.    Due to recent changes in healthcare laws, you may see results of your pathology and/or laboratory studies on MyChart before the doctors have had a chance to review them. We understand that in some cases there may be results that are confusing or concerning to you. Please understand that not all results are received at the same time and often the doctors may need to interpret multiple results in order to provide you with the best plan of care or course of treatment. Therefore, we ask that you please give [place] 2 business days to thoroughly review all your results before contacting the office for clarification. Should we see a critical lab result, you will be contacted sooner.   If You Need Anything After Your Visit  If you have any questions or concerns for your doctor, please call our main line at [phone] and press option 4 to reach your doctor's medical assistant. If no one answers, please leave a  voicemail as directed and we will return your call as soon as possible. Messages left after 4 pm will be answered the following business day.   You may also send [place] a message via [place]. We typically respond to MyChart messages within 1-2 business days.  For prescription refills, please ask your pharmacy to contact our office. Our fax number is [phone].  If you have an urgent issue when the clinic is closed that cannot wait until the next business day, you can page your doctor at the number below.    Please note that while we do our best to be available for urgent issues outside of office hours, we are not available 24/7.   If you have an urgent issue and are unable to reach [place], you may choose to seek medical care at your doctor's office, retail clinic, urgent care center, or emergency room.  If you have a medical emergency, please immediately call 911 or go to the emergency department.  Pager Numbers  - Dr. Nehemiah Massed: [phone]  - Dr. Laurence Ferrari: [phone]  - Dr. Nicole Kindred: [phone]  In the event of inclement weather, please call our main line at [phone] for an update on the status of any delays or closures.  Dermatology Medication Tips: Please keep the boxes that topical medications come in in order to help keep track of the instructions about where and how to use these. Pharmacies typically print the medication instructions only on the boxes and not directly on the medication tubes.   If your medication is too expensive, please contact  our office at [phone] option 4 or send [place] a message through [place].   We are unable to tell what your co-pay for medications will be in advance as this is different depending on your insurance coverage. However, we may be able to find a substitute medication at lower cost or fill out paperwork to get insurance to cover a needed medication.   If a prior authorization is required to get your medication covered by your insurance  company, please allow [place] 1-2 business days to complete this process.  Drug prices often vary depending on where the prescription is filled and some pharmacies may offer cheaper prices.  The website www.goodrx.com contains coupons for medications through different pharmacies. The prices here do not account for what the cost may be with help from insurance (it may be cheaper with your insurance), but the website can give you the price if you did not use any insurance.  - You can print the associated coupon and take it with your prescription to the pharmacy.  - You may also stop by our office during regular business hours and pick up a GoodRx coupon card.  - If you need your prescription sent electronically to a different pharmacy, notify our office through [facility] or by phone at [phone] option 4.     Si Usted Necesita Algo Despus de Su Visita  Tambin puede enviarnos un mensaje a travs de Pharmacist, community. Por lo general respondemos a los mensajes de MyChart en el transcurso de 1 a 2 das hbiles.  Para renovar recetas, por favor pida a su farmacia que se ponga en contacto con nuestra oficina. Harland Dingwall de fax es [place] [phone].  Si tiene un asunto urgente cuando la clnica est cerrada y que no puede esperar hasta el siguiente da hbil, puede llamar/localizar a su doctor(a) al nmero que aparece a continuacin.   Por favor, tenga en cuenta que aunque hacemos todo lo posible para estar disponibles para asuntos urgentes fuera del horario de [place], no estamos disponibles las 24 horas del da, los 7 das de la [place].   Si tiene un problema urgente y no puede comunicarse con nosotros, puede optar por buscar atencin mdica  en el consultorio de su doctor(a), en una clnica privada, en un centro de atencin urgente o en una sala de emergencias.  Si tiene Engineering geologist, por favor llame inmediatamente al 911 o vaya a la sala de emergencias.  Nmeros de bper  - Dr.  Nehemiah Massed: [phone]  - Dra. Moye: [phone]  - Dra. Nicole Kindred: [phone]  En caso de inclemencias del [place], por favor llame a Johnsie Kindred principal al [phone] para una actualizacin sobre el [place] de cualquier retraso o cierre.  Consejos para la medicacin en dermatologa: Por favor, guarde las cajas en las que vienen los medicamentos de uso tpico para ayudarle a seguir las instrucciones sobre dnde y cmo usarlos. Las farmacias generalmente imprimen las instrucciones del medicamento slo en las cajas y no directamente en los tubos del [place].   Si su medicamento es muy caro, por favor, pngase en contacto con Zigmund Daniel llamando al [phone] y presione la opcin 4 o envenos un mensaje a travs de Pharmacist, community.   No podemos decirle cul ser su copago por los medicamentos por adelantado ya que esto es diferente dependiendo de la cobertura de su seguro. Sin embargo, es posible que podamos encontrar un medicamento sustituto a Electrical engineer un formulario para que el seguro cubra el medicamento  que se considera necesario.   Si se requiere una autorizacin previa para que su compaa de seguros [place] su medicamento, por favor permtanos de 1 a 2 das hbiles para completar este proceso.  Los precios de los medicamentos varan con frecuencia dependiendo del Environmental consultant de dnde se surte la receta y alguna farmacias pueden ofrecer precios ms baratos.  El sitio web www.goodrx.com tiene cupones para medicamentos de Airline pilot. Los precios aqu no tienen en cuenta lo que podra costar con la ayuda del seguro (puede ser ms barato con su seguro), pero el sitio web puede darle el precio si no utiliz Research scientist (physical sciences).  - Puede imprimir el cupn correspondiente y llevarlo con su receta a la farmacia.  - Tambin puede pasar por nuestra oficina durante el horario de atencin regular y Charity fundraiser una tarjeta de cupones de GoodRx.  - Si necesita que su receta se enve  electrnicamente a una farmacia diferente, informe a nuestra oficina a travs de MyChart de [place] o por telfono llamando al [phone] y presione la opcin 4.

## 2022-08-12 NOTE — Progress Notes (Signed)
Follow-Up Visit   Subjective  Louis Jackson is a 66 y.o. male who presents for the following: Actinic Keratosis (6 month recheck. Tx with LN2. Scalp, left infraorbital ). The patient has spots, moles and lesions to be evaluated, some may be new or changing and the patient has concerns that these could be cancer.  The following portions of the chart were reviewed this encounter and updated as appropriate:  Tobacco  Allergies  Meds  Problems  Med Hx  Surg Hx  Fam Hx     Review of Systems: No other skin or systemic complaints except as noted in HPI or Assessment and Plan.  Objective  Well appearing patient in no apparent distress; mood and affect are within normal limits.  A focused examination was performed including scalp, face, neck, ears, arms, hands. Relevant physical exam findings are noted in the Assessment and Plan.  Scalp x1 Erythematous thin papules/macules with gritty scale.   Scalp, face Pink patches with greasy scale.    Assessment & Plan  AK (actinic keratosis) Scalp x1 Actinic keratoses are precancerous spots that appear secondary to cumulative UV radiation exposure/sun exposure over time. They are chronic with expected duration over 1 year. A portion of actinic keratoses will progress to squamous cell carcinoma of the skin. It is not possible to reliably predict which spots will progress to skin cancer and so treatment is recommended to prevent development of skin cancer.  Recommend daily broad spectrum sunscreen SPF 30+ to sun-exposed areas, reapply every 2 hours as needed.  Recommend staying in the shade or wearing long sleeves, sun glasses (UVA+UVB protection) and wide brim hats (4-inch brim around the entire circumference of the hat). Call for new or changing lesions.  Destruction of lesion - Scalp x1 Complexity: simple   Destruction method: cryotherapy   Informed consent: discussed and consent obtained   Timeout:  patient name, date of birth, surgical  site, and procedure verified Lesion destroyed using liquid nitrogen: Yes   Region frozen until ice ball extended beyond lesion: Yes   Outcome: patient tolerated procedure well with no complications   Post-procedure details: wound care instructions given   Additional details:  Prior to procedure, discussed risks of blister formation, small wound, skin dyspigmentation, or rare scar following cryotherapy. Recommend Vaseline ointment to treated areas while healing.   Seborrheic dermatitis Scalp, face Chronic and persistent condition with duration or expected duration over one year. Condition is bothersome/symptomatic for patient. Currently flared. Seborrheic Dermatitis  -  is a chronic persistent rash characterized by pinkness and scaling most commonly of the mid face but also can occur on the scalp (dandruff), ears; mid chest, mid back and groin.  It tends to be exacerbated by stress and cooler weather.  People who have neurologic disease may experience new onset or exacerbation of existing seborrheic dermatitis.  The condition is not curable but treatable and can be controlled.  Start Ketoconazole 2% cream M, W, F at bedtime to face as needed. Start Hydrocortisone 2.5% cream  Tue., Thur., Sat. at bedtime as needed only.  Topical steroids (such as triamcinolone, fluocinolone, fluocinonide, mometasone, clobetasol, halobetasol, betamethasone, hydrocortisone) can cause thinning and lightening of the skin if they are used for too long in the same area. Your physician has selected the right strength medicine for your problem and area affected on the body. Please use your medication only as directed by your physician to prevent side effects.   ketoconazole (NIZORAL) 2 % cream - Scalp, face Apply  M, W, F at bedtime to face as needed. hydrocortisone 2.5 % cream - Scalp, face Apply Tue., Thur., Sat. at bedtime as needed only.  Purpura - Chronic; persistent and recurrent.  Treatable, but not curable. -  Violaceous macules and patches - Benign - Related to trauma, age, sun damage and/or use of blood thinners, chronic use of topical and/or oral steroids - Observe - Can use OTC arnica containing moisturizer such as Dermend Bruise Formula if desired - Call for worsening or other concerns  Actinic Damage - chronic, secondary to cumulative UV radiation exposure/sun exposure over time - diffuse scaly erythematous macules with underlying dyspigmentation - Recommend daily broad spectrum sunscreen SPF 30+ to sun-exposed areas, reapply every 2 hours as needed.  - Recommend staying in the shade or wearing long sleeves, sun glasses (UVA+UVB protection) and wide brim hats (4-inch brim around the entire circumference of the hat). - Call for new or changing lesions.  Return for AK Follow Up 6-12  months.  I, Emelia Salisbury, CMA, am acting as scribe for Sarina Ser, MD. Documentation: I have reviewed the above documentation for accuracy and completeness, and I agree with the above.  Sarina Ser, MD

## 2022-10-21 ENCOUNTER — Telehealth: Payer: Self-pay | Admitting: *Deleted

## 2022-10-21 NOTE — Telephone Encounter (Signed)
Okay to pick up another handicap placard form.  X 6 months.  Please notify patient.  Thanks, Dr. Amalia Hailey

## 2022-10-21 NOTE — Telephone Encounter (Signed)
Patient will be in today to pick a handicap placard, going to [facility] location, has been approved

## 2022-10-21 NOTE — Telephone Encounter (Signed)
Patient is calling to request another handicap placard, previous runs out in December, please advise.

## 2023-03-29 ENCOUNTER — Encounter: Payer: Self-pay | Admitting: Emergency Medicine

## 2023-03-29 ENCOUNTER — Emergency Department: Payer: Medicare Other

## 2023-03-29 ENCOUNTER — Emergency Department
Admission: EM | Admit: 2023-03-29 | Discharge: 2023-03-29 | Disposition: A | Discharge status: Home / Self Care | Payer: Medicare Other | Attending: Emergency Medicine | Admitting: Emergency Medicine

## 2023-03-29 DIAGNOSIS — I700 Atherosclerosis of aorta: Secondary | ICD-10-CM | POA: Insufficient documentation

## 2023-03-29 DIAGNOSIS — N281 Cyst of kidney, acquired: Secondary | ICD-10-CM | POA: Insufficient documentation

## 2023-03-29 DIAGNOSIS — R3915 Urgency of urination: Secondary | ICD-10-CM | POA: Diagnosis present

## 2023-03-29 DIAGNOSIS — N200 Calculus of kidney: Secondary | ICD-10-CM | POA: Diagnosis not present

## 2023-03-29 DIAGNOSIS — N390 Urinary tract infection, site not specified: Secondary | ICD-10-CM | POA: Diagnosis not present

## 2023-03-29 LAB — COMPREHENSIVE METABOLIC PANEL
ALT: 20 U/L (ref 0–44)
AST: 19 U/L (ref 15–41)
Albumin: 4 g/dL (ref 3.5–5.0)
Alkaline Phosphatase: 54 U/L (ref 38–126)
Anion gap: 9 (ref 5–15)
BUN: 26 mg/dL — ABNORMAL HIGH (ref 8–23)
CO2: 21 mmol/L — ABNORMAL LOW (ref 22–32)
Calcium: 8.9 mg/dL (ref 8.9–10.3)
Chloride: 110 mmol/L (ref 98–111)
Creatinine, Ser: 1.24 mg/dL (ref 0.61–1.24)
GFR, Estimated: >60 mL/min (ref >60)
Glucose, Bld: 104 mg/dL — ABNORMAL HIGH (ref 70–99)
Potassium: 3.9 mmol/L (ref 3.5–5.1)
Sodium: 140 mmol/L (ref 135–145)
Total Bilirubin: 0.9 mg/dL (ref 0.3–1.2)
Total Protein: 6.9 g/dL (ref 6.5–8.1)

## 2023-03-29 LAB — CBC WITH DIFFERENTIAL/PLATELET
Abs Immature Granulocytes: 0.05 10*3/uL (ref 0.00–0.07)
Basophils Absolute: 0.1 10*3/uL (ref 0.0–0.1)
Basophils Relative: 1 %
Eosinophils Absolute: 0.1 10*3/uL (ref 0.0–0.5)
Eosinophils Relative: 1 %
HCT: 44 % (ref 39.0–52.0)
Hemoglobin: 14.5 g/dL (ref 13.0–17.0)
Immature Granulocytes: 0 %
Lymphocytes Relative: 19 %
Lymphs Abs: 2.3 10*3/uL (ref 0.7–4.0)
MCH: 31 pg (ref 26.0–34.0)
MCHC: 33 g/dL (ref 30.0–36.0)
MCV: 94.2 fL (ref 80.0–100.0)
Monocytes Absolute: 1.1 10*3/uL — ABNORMAL HIGH (ref 0.1–1.0)
Monocytes Relative: 10 %
Neutro Abs: 8.3 10*3/uL — ABNORMAL HIGH (ref 1.7–7.7)
Neutrophils Relative %: 69 %
Platelets: 154 10*3/uL (ref 150–400)
RBC: 4.67 MIL/uL (ref 4.22–5.81)
RDW: 13.3 % (ref 11.5–15.5)
WBC: 12 10*3/uL — ABNORMAL HIGH (ref 4.0–10.5)
nRBC: 0 % (ref 0.0–0.2)

## 2023-03-29 LAB — URINALYSIS, ROUTINE W REFLEX MICROSCOPIC
Bilirubin Urine: NEGATIVE
Glucose, UA: NEGATIVE mg/dL
Ketones, ur: NEGATIVE mg/dL
Nitrite: NEGATIVE
Protein, ur: 100 mg/dL — AB
RBC / HPF: >50 RBC/hpf (ref 0–5)
Specific Gravity, Urine: 1.019 (ref 1.005–1.030)
Squamous Epithelial / HPF: NONE SEEN /HPF (ref 0–5)
WBC, UA: >50 WBC/hpf (ref 0–5)
pH: 5 (ref 5.0–8.0)

## 2023-03-29 MED ORDER — CEPHALEXIN 500 MG PO CAPS: 500.0000 mg | ORAL_CAPSULE | Freq: Two times a day (BID) | ORAL | 0 refills | Status: DC

## 2023-03-29 MED ORDER — SODIUM CHLORIDE 0.9 % IV SOLN
1.0000 g | INTRAVENOUS | Status: AC
  Administered 2023-03-29: 1 g via INTRAVENOUS
  Filled 2023-03-29: qty 10

## 2023-03-29 NOTE — ED Triage Notes (Signed)
Pt presents ambulatory to triage via POV with complaints of L sided flank pain that started 2-3 days ago. Hx of recurrent kidney stones. Pt endorses hematuria with associated urgency over the last day. Rates the pain in his groin 8/10. A&Ox4 at this time. Denies CP or SOB.

## 2023-03-29 NOTE — ED Provider Notes (Signed)
[facility] Provider Note    Event Date/Time   First MD Initiated Contact with Patient 03/29/23 [phone]     (approximate)   History   Flank Pain   HPI  Louis Jackson is a 67 y.o. male who has a history of multiple kidney stones, reports he is possible 50 stones in his lifetime   Over the last 2 days he has noticed a little bit of blood and a achy pain over his lower pelvis.  He reports it is not in the area of his buttock, does not radiate, but he feels like he might have a bladder infection.  He reports he has passed kidney stones the past but has not experienced any of the severe sharp flank or excruciating pain that he typically accompanies a kidney stone.  He has had no nausea or vomiting is eating and drinking well.  He is out doing activities like mowing lawns etc.  He reports he has had maybe a little bit of achiness on the left side with further questioning but reports it is not sharp pain does not feel like past kidney stone passing.  He has had increased urinary urgency  Had a little bit of a fever on Friday  Physical Exam   Triage Vital Signs: ED Triage Vitals  Enc Vitals Group     BP 03/29/23 0456 117/64     Pulse Rate 03/29/23 0456 62     Resp 03/29/23 0456 18     Temp 03/29/23 0456 98.6 F (37 C)     Temp Source 03/29/23 0456 Oral     SpO2 03/29/23 0456 98 %     Weight 03/29/23 0455 272 lb 4.3 oz (123.5 kg)     Height 03/29/23 0455 5\' 11"  (1.803 m)     Head Circumference --      Peak Flow --      Pain Score 03/29/23 0454 8     Pain Loc --      Pain Edu? --      Excl. in GC? --     Most recent vital signs: Vitals:   03/29/23 0456  BP: 117/64  Pulse: 62  Resp: 18  Temp: 98.6 F (37 C)  SpO2: 98%     General: Awake, no distress.  Very pleasant.  Sitting comfortably.  Does not appear to be in pain. CV:  Good peripheral perfusion.  Normal rate Resp:  Normal effort.  Abd:  No distention.  Abdomen soft nontender nondistended  throughout.  He does report the area of discomfort that he is experiencing especially when he urinates to be over the suprapubic region.  No costovertebral angle tenderness bilaterally.  There is no peritonitis.  No rashes.  His skin is tan Other:  Mucous membranes well hydrated.  He appears very nontoxic.   ED Results / Procedures / Treatments   Labs (all labs ordered are listed, but only abnormal results are displayed) Labs Reviewed  CBC WITH DIFFERENTIAL/PLATELET - Abnormal; Notable for the following components:      Result Value   WBC 12.0 (*)    Neutro Abs 8.3 (*)    Monocytes Absolute 1.1 (*)    All other components within normal limits  COMPREHENSIVE METABOLIC PANEL - Abnormal; Notable for the following components:   CO2 21 (*)    Glucose, Bld 104 (*)    BUN 26 (*)    All other components within normal limits  URINALYSIS, ROUTINE W REFLEX MICROSCOPIC -  Abnormal; Notable for the following components:   Color, Urine YELLOW (*)    APPearance CLOUDY (*)    Hgb urine dipstick LARGE (*)    Protein, ur 100 (*)    Leukocytes,Ua LARGE (*)    Bacteria, UA FEW (*)    All other components within normal limits   Labs interpreted as normal renal function.  Urinalysis notable for signs of urinary tract infection also hematuria  Labs demonstrate elevated white count.  Patient does not meet sepsis criteria.  EKG     RADIOLOGY I personally interpreted the patient's CT abdomen pelvis for gross pathology, kidney stones noted in the kidneys and cystlike structures noted on the left kidney.  Rads report IMPRESSION: 1. Bilateral nephrolithiasis, including a bulky 15 mm left renal midpole calculus, but no obstructive uropathy. Large and exophytic but benign left renal cysts are slowly enlarging, but without evidence of associated inflammation (no follow-up imaging recommended).  2. Decompressed urinary bladder, but might be inflamed. Consider UTI or cystitis.  3. No other acute or  inflammatory process identified in the noncontrast abdomen or pelvis. 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 Chronic L5 pars fractures and grade 1 spondylolisthesis at L5-S1. Aortic Atherosclerosis (ICD10-I70.0).   PROCEDURES:  Critical Care performed: No  Procedures   MEDICATIONS ORDERED IN ED: Medications  cefTRIAXone (ROCEPHIN) 1 g in sodium chloride 0.9 % 100 mL IVPB (has no administration in time range)     IMPRESSION / MDM / ASSESSMENT AND PLAN / ED COURSE  I reviewed the triage vital signs and the nursing notes.                              Differential diagnosis includes, but is not limited to, possible nephrolithiasis, pyelonephritis, urinary tract infection, prostatitis, intra-abdominal infection, etc.  Based on his clinical history and symptoms dysuria and suprapubic discomfort and no pain or discomfort in the buttock area or perineum this seems most consistent with urinary tract infection with likely a slight hemorrhagic component.  Imaging was reviewed and demonstrates no obstructive uropathy.  He is alert well-oriented nontoxic-appearing.  No prior urine cultures except 1 from almost 10 years ago.  Will start empirically on cephalexin for 7-day course and give 1 dose of IV Rocephin here prior to departure.  Recommended patient follow-up with his primary care.  Discussed careful return precautions with the patient as well as treatment recommendations.  He is agreeable with plan  Patient's presentation is most consistent with acute complicated illness / injury requiring diagnostic workup.          FINAL CLINICAL IMPRESSION(S) / ED DIAGNOSES   Final diagnoses:  Acute lower urinary tract infection     Rx / DC Orders   ED Discharge Orders          Ordered    cephALEXin (KEFLEX) 500 MG capsule  2 times daily        03/29/23 0735             Note:  This document was prepared using Dragon voice recognition software  and may include unintentional dictation errors.   Sharyn Creamer, MD 03/29/23 [phone]

## 2023-06-11 ENCOUNTER — Emergency Department
Admission: EM | Admit: 2023-06-11 | Discharge: 2023-06-11 | Discharge status: Against Medical Advice | Payer: Medicare Other

## 2023-06-12 ENCOUNTER — Other Ambulatory Visit: Payer: Self-pay

## 2023-06-12 ENCOUNTER — Emergency Department: Payer: Medicare Other

## 2023-06-12 ENCOUNTER — Emergency Department
Admission: EM | Admit: 2023-06-12 | Discharge: 2023-06-12 | Disposition: A | Discharge status: Home / Self Care | Payer: Medicare Other | Attending: Emergency Medicine | Admitting: Emergency Medicine

## 2023-06-12 DIAGNOSIS — N200 Calculus of kidney: Secondary | ICD-10-CM | POA: Insufficient documentation

## 2023-06-12 DIAGNOSIS — M549 Dorsalgia, unspecified: Secondary | ICD-10-CM | POA: Diagnosis present

## 2023-06-12 LAB — COMPREHENSIVE METABOLIC PANEL
ALT: 22 U/L (ref 0–44)
AST: 20 U/L (ref 15–41)
Albumin: 4.4 g/dL (ref 3.5–5.0)
Alkaline Phosphatase: 48 U/L (ref 38–126)
Anion gap: 12 (ref 5–15)
BUN: 30 mg/dL — ABNORMAL HIGH (ref 8–23)
CO2: 22 mmol/L (ref 22–32)
Calcium: 9.3 mg/dL (ref 8.9–10.3)
Chloride: 104 mmol/L (ref 98–111)
Creatinine, Ser: 1.22 mg/dL (ref 0.61–1.24)
GFR, Estimated: >60 mL/min (ref >60)
Glucose, Bld: 141 mg/dL — ABNORMAL HIGH (ref 70–99)
Potassium: 3.8 mmol/L (ref 3.5–5.1)
Sodium: 138 mmol/L (ref 135–145)
Total Bilirubin: 1 mg/dL (ref 0.3–1.2)
Total Protein: 7.3 g/dL (ref 6.5–8.1)

## 2023-06-12 LAB — URINALYSIS, W/ REFLEX TO CULTURE (INFECTION SUSPECTED)
Bacteria, UA: NONE SEEN
Bilirubin Urine: NEGATIVE
Glucose, UA: NEGATIVE mg/dL
Ketones, ur: 20 mg/dL — AB
Leukocytes,Ua: NEGATIVE
Nitrite: NEGATIVE
Protein, ur: NEGATIVE mg/dL
Specific Gravity, Urine: 1.024 (ref 1.005–1.030)
Squamous Epithelial / HPF: NONE SEEN /HPF (ref 0–5)
pH: 5 (ref 5.0–8.0)

## 2023-06-12 LAB — CBC
HCT: 44 % (ref 39.0–52.0)
Hemoglobin: 14.9 g/dL (ref 13.0–17.0)
MCH: 30.8 pg (ref 26.0–34.0)
MCHC: 33.9 g/dL (ref 30.0–36.0)
MCV: 91.1 fL (ref 80.0–100.0)
Platelets: 138 10*3/uL — ABNORMAL LOW (ref 150–400)
RBC: 4.83 MIL/uL (ref 4.22–5.81)
RDW: 13.3 % (ref 11.5–15.5)
WBC: 11.3 10*3/uL — ABNORMAL HIGH (ref 4.0–10.5)
nRBC: 0 % (ref 0.0–0.2)

## 2023-06-12 LAB — LIPASE, BLOOD: Lipase: 33 U/L (ref 11–51)

## 2023-06-12 MED ORDER — TAMSULOSIN HCL 0.4 MG PO CAPS: 0.4000 mg | ORAL_CAPSULE | Freq: Every day | ORAL | 0 refills | Status: DC

## 2023-06-12 MED ORDER — ONDANSETRON 4 MG PO TBDP: 4.0000 mg | ORAL_TABLET | Freq: Three times a day (TID) | ORAL | 0 refills | Status: DC | PRN

## 2023-06-12 MED ORDER — HYDROMORPHONE HCL 1 MG/ML IJ SOLN: 1.0000 mg | Freq: Once | INTRAMUSCULAR | Status: DC

## 2023-06-12 MED ORDER — MORPHINE SULFATE (PF) 4 MG/ML IV SOLN
6.0000 mg | Freq: Once | INTRAVENOUS | Status: AC
  Administered 2023-06-12: 6 mg via INTRAVENOUS
  Filled 2023-06-12: qty 2

## 2023-06-12 MED ORDER — OXYCODONE-ACETAMINOPHEN 5-325 MG PO TABS
1.0000 | ORAL_TABLET | ORAL | 0 refills | Status: AC | PRN
Start: 2023-06-12 — End: 2023-06-15

## 2023-06-12 MED ORDER — ONDANSETRON HCL 4 MG/2ML IJ SOLN
4.0000 mg | Freq: Once | INTRAMUSCULAR | Status: AC
  Administered 2023-06-12: 4 mg via INTRAVENOUS
  Filled 2023-06-12: qty 2

## 2023-06-12 MED ORDER — KETOROLAC TROMETHAMINE 15 MG/ML IJ SOLN
15.0000 mg | Freq: Once | INTRAMUSCULAR | Status: AC
  Administered 2023-06-12: 15 mg via INTRAVENOUS
  Filled 2023-06-12: qty 1

## 2023-06-12 MED ORDER — SODIUM CHLORIDE 0.9 % IV BOLUS
1000.0000 mL | Freq: Once | INTRAVENOUS | Status: AC
  Administered 2023-06-12: 1000 mL via INTRAVENOUS

## 2023-06-12 NOTE — Discharge Instructions (Signed)
You were seen in the emergency department and diagnosed with a kidney stone.  It is importantly follow-up closely with urology.  Return to the emergency department if you have worsening symptoms.  Pain control:  Ibuprofen (motrin/aleve/advil) - You can take 3-4 tablets (600-800 mg) every 6 hours as needed for pain/fever.  Acetaminophen (tylenol) - You can take 2 extra strength tablets (1000 mg) every 6 hours as needed for pain/fever.  You can alternate these medications or take them together.  Make sure you eat food/drink water when taking these medications.  If you are still hurting you can take an opioid pain medication, only take if you are in severe pain.  Percocet - you were given a prescription for narcotic pain medications.   These are very addictive medications.  These medications can make you constipated.  If you need to take more than 1-2 doses, start a stool softner.  If you become constipated, take 1-2 capfull of MiraLAX mixed in 16 oz of water, can repeat untill having regular bowel movements.  Keep this medication out of reach of any children.  Flomax (tamsulosin) -this medication will make you urinate more frequently, it is importantly stay hydrated with water while taking this medication  zofran (ondansetron) - nausea medication, take 1 tablet every 8 hours as needed for nausea/vomiting.  

## 2023-06-12 NOTE — ED Triage Notes (Addendum)
Pt to ED via POV from home. Pt reports left sided back pain x2 days. Pt reports took 2 oxy 2.5hrs ago with no relief. Pt also reports sweats, nausea, hematuria and difficulty urinating. Pt reports extensive hx of kidney stones.

## 2023-06-12 NOTE — ED Provider Notes (Signed)
[name] Provider Note    Event Date/Time   First MD Initiated Contact with Patient 06/12/23 [phone]     (approximate)   History   Back Pain   HPI  Louis Jackson is a 67 y.o. male past medical history significant for prior kidney stones who presents to the emergency department with left-sided back pain.  States that he started having worsening back pain over the past 2 days that significantly worsened today.  Took oxycodone which she has at home prescribed for prior kidney stones and states that that improve the pain some but continues to have ongoing pain.  Recently diagnosed with urinary tract infection and states he has been on multiple different antibiotics for the UTI.  This feels similar to prior episodes of kidney stones.  Has required lithotripsy in the past.  States he has had approximately 50 kidney stones in the past.  Does not currently have a urologist.  Denies fever or chills.  Nausea but no episodes of vomiting.  Denies dysuria, urinary urgency or frequency.     Physical Exam   Triage Vital Signs: ED Triage Vitals  Encounter Vitals Group     BP 06/12/23 0851 139/80     Systolic BP Percentile --      Diastolic BP Percentile --      Pulse Rate 06/12/23 0849 60     Resp 06/12/23 0849 18     Temp 06/12/23 0849 (!) 97.5 F (36.4 C)     Temp src --      SpO2 06/12/23 0849 96 %     Weight --      Height --      Head Circumference --      Peak Flow --      Pain Score 06/12/23 0847 7     Pain Loc --      Pain Education --      Exclude from Growth Chart --     Most recent vital signs: Vitals:   06/12/23 0849 06/12/23 0851  BP:  139/80  Pulse: 60   Resp: 18   Temp: (!) 97.5 F (36.4 C)   SpO2: 96%     Physical Exam Constitutional:      General: He is in acute distress.     Appearance: He is well-developed.  HENT:     Head: Atraumatic.  Eyes:     Conjunctiva/sclera: Conjunctivae normal.  Cardiovascular:     Rate and  Rhythm: Regular rhythm.  Pulmonary:     Effort: No respiratory distress.  Abdominal:     Tenderness: There is left CVA tenderness.  Musculoskeletal:        General: Normal range of motion.     Cervical back: Normal range of motion.  Skin:    General: Skin is warm.     Capillary Refill: Capillary refill takes less than 2 seconds.  Neurological:     Mental Status: He is alert. Mental status is at baseline.     IMPRESSION / MDM / ASSESSMENT AND PLAN / ED COURSE  I reviewed the triage vital signs and the nursing notes.  Differential diagnosis including kidney stone, pyelonephritis, diverticulitis   RADIOLOGY my interpretation of imaging: CT abdomen and pelvis stone study -left-sided obstructive kidney stone  Read a 7 mm obstructing kidney stone with hydronephrosis.  LABS (all labs ordered are listed, but only abnormal results are displayed) Labs interpreted as -    Labs Reviewed  COMPREHENSIVE METABOLIC PANEL -  Abnormal; Notable for the following components:      Result Value   Glucose, Bld 141 (*)    BUN 30 (*)    All other components within normal limits  CBC - Abnormal; Notable for the following components:   WBC 11.3 (*)    Platelets 138 (*)    All other components within normal limits  URINALYSIS, W/ REFLEX TO CULTURE (INFECTION SUSPECTED) - Abnormal; Notable for the following components:   Color, Urine YELLOW (*)    APPearance CLEAR (*)    Hgb urine dipstick MODERATE (*)    Ketones, ur 20 (*)    All other components within normal limits  LIPASE, BLOOD     MDM    Treated with IV fluids, IV Zofran, IV morphine  Pain significantly improved.  Discussed with urology, lithotripsy tract is not available today.  Pain is well-controlled at this time.  Recommended close follow-up in office next week.  Discussed Toradol, patient states that he wants to do Motrin at home.  Given a prescription for Percocet.  Discussed the risk and benefits of this medication.  Started  on Flomax.  Given return precautions.  No signs of pyelonephritis or an infected kidney stone.   PROCEDURES:  Critical Care performed: No  Procedures  Patient's presentation is most consistent with acute complicated illness / injury requiring diagnostic workup.   MEDICATIONS ORDERED IN ED: Medications  ketorolac (TORADOL) 15 MG/ML injection 15 mg (has no administration in time range)  sodium chloride 0.9 % bolus 1,000 mL (1,000 mLs Intravenous New Bag/Given 06/12/23 0926)  ondansetron (ZOFRAN) injection 4 mg (4 mg Intravenous Given 06/12/23 0925)  morphine (PF) 4 MG/ML injection 6 mg (6 mg Intravenous Given 06/12/23 0926)    FINAL CLINICAL IMPRESSION(S) / ED DIAGNOSES   Final diagnoses:  Kidney stone     Rx / DC Orders   ED Discharge Orders          Ordered    tamsulosin (FLOMAX) 0.4 MG CAPS capsule  Daily        06/12/23 1132    oxyCODONE-acetaminophen (PERCOCET) 5-325 MG tablet  Every 4 hours PRN        06/12/23 1132    ondansetron (ZOFRAN-ODT) 4 MG disintegrating tablet  Every 8 hours PRN        06/12/23 1132             Note:  This document was prepared using Dragon voice recognition software and may include unintentional dictation errors.   Corena Herter, MD 06/12/23 1137

## 2023-06-17 ENCOUNTER — Other Ambulatory Visit
Admission: RE | Admit: 2023-06-17 | Discharge: 2023-06-17 | Disposition: A | Discharge status: Home / Self Care | Payer: Medicare Other | Source: Home / Self Care | Attending: Urology | Admitting: Urology

## 2023-06-17 ENCOUNTER — Encounter: Payer: Self-pay | Admitting: Urology

## 2023-06-17 ENCOUNTER — Ambulatory Visit: Admission: RE | Admit: 2023-06-17 | Payer: Medicare Other | Source: Ambulatory Visit

## 2023-06-17 ENCOUNTER — Ambulatory Visit: Payer: Medicare Other | Admitting: Urology

## 2023-06-17 ENCOUNTER — Ambulatory Visit
Admission: RE | Admit: 2023-06-17 | Discharge: 2023-06-17 | Disposition: A | Discharge status: Home / Self Care | Payer: Medicare Other | Attending: Urology | Admitting: Urology

## 2023-06-17 VITALS — BP 171/84 | HR 56 | Ht 70.0 in | Wt 272.4 lb

## 2023-06-17 DIAGNOSIS — N200 Calculus of kidney: Secondary | ICD-10-CM | POA: Diagnosis not present

## 2023-06-17 DIAGNOSIS — N201 Calculus of ureter: Secondary | ICD-10-CM

## 2023-06-17 DIAGNOSIS — N529 Male erectile dysfunction, unspecified: Secondary | ICD-10-CM | POA: Diagnosis not present

## 2023-06-17 MED ORDER — TADALAFIL 20 MG PO TABS: 20.0000 mg | ORAL_TABLET | Freq: Every day | ORAL | 6 refills | Status: AC | PRN

## 2023-06-17 NOTE — Progress Notes (Signed)
   06/17/23 8:52 AM   Louis Jackson 1956-04-28 [id number]  CC: Left ureteral stone, recurrent nephrolithiasis, recent UTI, ED  HPI: 67 year old male who reports at least 50 prior stone events, has required shockwave lithotripsy at least 3 times.  He presented to the ER 06/12/2023 with left-sided flank pain and CT showed a 7 mm left mid ureteral stone with upstream hydronephrosis.  His symptoms have resolved since that time and he denies any flank pain or urinary symptoms.  He also had an E. coli UTI in June 2024 that took multiple courses of antibiotics to resolve, denies any urinary symptoms today.  He also has questions about ED today.  Previously has trialed 100 mg sildenafil on demand with no improvement.   PMH: Past Medical History:  Diagnosis Date   Bradycardia    Chronic kidney disease    Hypertension    Nephrolithiasis    Obesity    Pure hypercholesterolemia, unspecified     Social History:  reports that he has never smoked. He has quit using smokeless tobacco. He reports current alcohol use of about 14.0 standard drinks of alcohol per week. He reports that he does not use drugs.  Physical Exam: BP (!) 171/84 (BP Location: Left Arm, Patient Position: Sitting, Cuff Size: Normal)   Pulse (!) 56   Ht 5\' 10"  (1.778 m)   Wt 272 lb 6.4 oz (123.6 kg)   BMI 39.09 kg/m    Constitutional:  Alert and oriented, No acute distress. Cardiovascular: No clubbing, cyanosis, or edema. Respiratory: Normal respiratory effort, no increased work of breathing. GI: Abdomen is soft, nontender, nondistended, no abdominal masses   Laboratory Data: Reviewed, see epic  Pertinent Imaging: I have personally viewed and interpreted the CT scan showing a 7 mm left mid ureteral stone with upstream hydronephrosis, larger 1 cm left lower pole renal stone, multiple small right renal stones with no right-sided hydronephrosis.  Left mid ureteral stone measures 900HU.  KUB today with no definitive  evidence of persistent left mid ureteral stone.  Assessment & Plan:   67 year old male with over 50 prior stone events, previously has required shockwave lithotripsy, presented to ER 06/12/2019 for left-sided flank pain and a 7 mm left mid ureteral stone.  Symptoms have resolved, and he thinks he may have passed the stone.  I do not appreciate any definitive evidence of a radiopaque mid ureteral stone on the left side on KUB today.  We discussed limitations of KUB, and cannot completely rule out persistent stone without repeat CT.  With his resolution of symptoms he would like to hold off on repeat imaging at this time, but return precautions were discussed.  Trial of Cialis 20 mg on demand for ED, as well as testosterone value today and will call with those results.  Recommended 24-hour urine metabolic workup for further evaluation of recurrent stones If recurrent flank pain recommend repeat CT stone protocol Yearly follow-up for KUB for stone surveillance  Legrand Rams, MD 06/17/2023  [facility] [address], [address] [place], [place] [address] [phone]

## 2023-06-20 NOTE — Telephone Encounter (Signed)
Appt scheduled for lab and OV with [facility]

## 2023-07-04 ENCOUNTER — Other Ambulatory Visit: Payer: Self-pay | Admitting: *Deleted

## 2023-07-04 DIAGNOSIS — R7989 Other specified abnormal findings of blood chemistry: Secondary | ICD-10-CM

## 2023-07-04 DIAGNOSIS — N529 Male erectile dysfunction, unspecified: Secondary | ICD-10-CM

## 2023-07-07 ENCOUNTER — Other Ambulatory Visit: Payer: Medicare Other

## 2023-07-07 DIAGNOSIS — R7989 Other specified abnormal findings of blood chemistry: Secondary | ICD-10-CM

## 2023-07-08 LAB — LUTEINIZING HORMONE: LH: 7.7 m[IU]/mL (ref 1.7–8.6)

## 2023-07-08 LAB — TESTOSTERONE: Testosterone: 318 ng/dL (ref 264–916)

## 2023-07-08 NOTE — H&P (View-Only) (Signed)
07/09/2023 9:36 AM   Louis Jackson Mar 24, 1956 [id number]  Referring provider: Kandyce Rud, MD [phone] [facility] [place],  [place] [address]  Urological history: 1.  Renal cysts -non contrast CT (2024) 11 cm left benign renal cyst   2.  Nephrolithiasis -CT renal stone study (06/2023) - mild LEFT-sided hydroureteronephrosis secondary to an obstructing 7 mm a mid ureterolithiasis. There is adjacent fat stranding along the renal collecting system proximally. Additional nonobstructing 12 mm LEFT-sided nephrolithiasis of the superior pole and multiple RIGHT-sided nonobstructing nephrolithiasis measuring up to 6 mm are predominantly the inferior pole -Longstanding history of nephrolithiasis with at least 50 prior stone events -ESWL x 3  3.  Erectile dysfunction -Contributing factors of age, hypertension, sleep apnea, BPH, obesity, lumbar disc disease, hypercholesterolemia and alcohol consumption -Serum testosterone 318 (06/2023)   Chief Complaint  Patient presents with   Follow-up   HPI: Louis Jackson is a 67 y.o. male who presents today for erectile dysfunction follow up.  He also has had had return of UTI symptoms.  Previous records reviewed.   He has been having issues with recurrent UTIs.  Now he is having symptoms of urgency, mild dysuria and hematuria that are intermittent in nature.  Patient denies any modifying or aggravating factors.  Patient denies any suprapubic/flank pain.  Patient denies any fevers, chills, nausea or vomiting.   UA yellow slightly cloudy, specific gravity 1.020, 2+ heme, pH 5.5, nitrate positive, 1+ leukocytes, greater than 30 WBCs, 3-10 RBCs, 0-10 epithelial cells and many bacteria.  KUB bilateral nephrolithiasis   SHIM 5  Patient still having spontaneous erections.  He denies any pain or curvature with erections.  He did not take the tadalafil 20 mg on-demand dosing.  He and his wife have  been married for a long time and sexual activity is not a big focus.     SHIM     Row Name 07/09/23 [phone]         SHIM: Over the last 6 months:   How do you rate your confidence that you could get and keep an erection? Very Low     When you had erections with sexual stimulation, how often were your erections hard enough for penetration (entering your partner)? Almost Never or Never     During sexual intercourse, how often were you able to maintain your erection after you had penetrated (entered) your partner? Almost Never or Never     During sexual intercourse, how difficult was it to maintain your erection to completion of intercourse? Extremely Difficult     When you attempted sexual intercourse, how often was it satisfactory for you? Almost Never or Never       SHIM Total Score   SHIM 5              Score: 1-7 Severe ED 8-11 Moderate ED 12-16 Mild-Moderate ED 17-21 Mild ED 22-25 No ED   PMH: Past Medical History:  Diagnosis Date   Bradycardia    Chronic kidney disease    Hypertension    Nephrolithiasis    Obesity    Pure hypercholesterolemia, unspecified     Surgical History: Past Surgical History:  Procedure Laterality Date   APPENDECTOMY     COLONOSCOPY WITH PROPOFOL N/A 06/17/2016   Procedure: COLONOSCOPY WITH PROPOFOL;  Surgeon: Scot Jun, MD;  Location: [facility] ENDOSCOPY;  Service: Endoscopy;  Laterality: N/A;   lithrotripsy  Home Medications:  Allergies as of 07/09/2023   No Known Allergies      Medication List        Accurate as of July 09, 2023  9:36 AM. If you have any questions, ask your nurse or doctor.          aspirin 81 MG tablet Take 81 mg by mouth daily.   colchicine 0.6 MG tablet Take by mouth.   diphenhydrAMINE 25 mg capsule Commonly known as: BENADRYL Take 50 mg by mouth at bedtime.   gabapentin 300 MG capsule Commonly known as: NEURONTIN 1 po tid   hydrocortisone 2.5 % cream Apply Tue., Thur., Sat. at  bedtime as needed only.   ketoconazole 2 % cream Commonly known as: NIZORAL Apply M, W, F at bedtime to face as needed.   naproxen sodium 220 MG tablet Commonly known as: ALEVE Take 220 mg by mouth daily as needed.   olmesartan-hydrochlorothiazide 20-12.5 MG tablet Commonly known as: BENICAR HCT Take 1 tablet by mouth daily.   pravastatin 20 MG tablet Commonly known as: PRAVACHOL Take by mouth.   sulfamethoxazole-trimethoprim 800-160 MG tablet Commonly known as: BACTRIM DS Take 1 tablet by mouth every 12 (twelve) hours. Started by: Michiel Cowboy   tadalafil 20 MG tablet Commonly known as: CIALIS Take 1 tablet (20 mg total) by mouth daily as needed for erectile dysfunction (take 45 minutes prior to sexual activity).        Allergies: No Known Allergies  Family History: No family history on file.  Social History:  reports that he has never smoked. He has quit using smokeless tobacco. He reports current alcohol use of about 14.0 standard drinks of alcohol per week. He reports that he does not use drugs.  ROS: Pertinent ROS in HPI  Physical Exam: BP 119/74   Pulse (!) 58   Ht 5\' 10"  (1.778 m)   Wt 272 lb (123.4 kg)   BMI 39.03 kg/m   Constitutional:  Well nourished. Alert and oriented, No acute distress. HEENT: [place] AT, moist mucus membranes.  Trachea midline Cardiovascular: No clubbing, cyanosis, or edema. Respiratory: Normal respiratory effort, no increased work of breathing. Neurologic: Grossly intact, no focal deficits, moving all 4 extremities. Psychiatric: Normal mood and affect.  Laboratory Data: Lab Results  Component Value Date   WBC 11.3 (H) 06/12/2023   HGB 14.9 06/12/2023   HCT 44.0 06/12/2023   MCV 91.1 06/12/2023   PLT 138 (L) 06/12/2023   Lab Results  Component Value Date   CREATININE 1.22 06/12/2023   Lab Results  Component Value Date   TESTOSTERONE 318 07/07/2023   Lab Results  Component Value Date   AST 20 06/12/2023   Lab  Results  Component Value Date   ALT 22 06/12/2023   Urinalysis Results for orders placed or performed in visit on 07/09/23  Microscopic Examination   Urine  Result Value Ref Range   WBC, UA >30 (A) 0 - 5 /hpf   RBC, Urine 3-10 (A) 0 - 2 /hpf   Epithelial Cells (non renal) 0-10 0 - 10 /hpf   Bacteria, UA Many (A) None seen/Few  Urinalysis, Complete  Result Value Ref Range   Specific Gravity, UA 1.020 1.005 - 1.030   pH, UA 5.5 5.0 - 7.5   Color, UA Yellow Yellow   Appearance Ur Hazy (A) Clear   Leukocytes,UA 1+ (A) Negative   Protein,UA Negative Negative/Trace   Glucose, UA Negative Negative   Ketones, UA Negative Negative  RBC, UA 2+ (A) Negative   Bilirubin, UA Negative Negative   Urobilinogen, Ur 0.2 0.2 - 1.0 mg/dL   Nitrite, UA Positive (A) Negative   Microscopic Examination See below:     I have reviewed the labs.   Pertinent Imaging: N/A  Assessment & Plan:    1.  Erectile dysfunction -I discussed that his testosterone level was within the laboratory normal values and if he wanted to pursue testosterone replacement therapy it would likely have to be out-of-pocket cost, he is not having significant symptoms of hypogonadism.  I did express that testosterone placement therapy is not indicated as a treatment for erectile dysfunction and it is highly likely he could get his testosterone levels up and still have issues with erectile dysfunction.  He expressed that he feels well and does not have symptoms of hypogonadism and therefore he will defer on any testosterone replacement therapy -He has the Cialis 20 mg on-demand dosing on hand and will try it in the future   2. Nephrolithiasis -UA w/ micro heme  -Urine culture pending -I recommended that we repeat the CT renal stone study at this time to evaluate for specific stone burden and to ensure the left ureteral stone to rule that out as a nidus and he is in agreement  -repeat CT ordered  -I will contact with  results  3. Suspected UTI -UA nitrate positive, with pyuria, with hematuria and bacteria -urine culture pending  -Septra sent to pharmacy, will adjust if necessary once culture results are available   Return for I will call patient with results.  These notes generated with voice recognition software. I apologize for typographical errors.  Cloretta Ned  [facility] [address]  [address] [place], [place] [address] [phone]

## 2023-07-08 NOTE — Progress Notes (Unsigned)
07/09/2023 9:36 AM   Louis Jackson Mar 21, 1956 [id number]  Referring provider: Kandyce Rud, MD [phone] [facility] - [facility] Medicine [place],  [place] [address]  Urological history: 1.  Renal cysts -non contrast CT (2024) 11 cm left benign renal cyst   2.  Nephrolithiasis -CT renal stone study (06/2023) - mild LEFT-sided hydroureteronephrosis secondary to an obstructing 7 mm a mid ureterolithiasis. There is adjacent fat stranding along the renal collecting system proximally. Additional nonobstructing 12 mm LEFT-sided nephrolithiasis of the superior pole and multiple RIGHT-sided nonobstructing nephrolithiasis measuring up to 6 mm are predominantly the inferior pole -Longstanding history of nephrolithiasis with at least 50 prior stone events -ESWL x 3  3.  Erectile dysfunction -Contributing factors of age, hypertension, sleep apnea, BPH, obesity, lumbar disc disease, hypercholesterolemia and alcohol consumption -Serum testosterone 318 (06/2023)   Chief Complaint  Patient presents with   Follow-up   HPI: Louis Jackson is a 67 y.o. male who presents today for erectile dysfunction follow up.  He also has had had return of UTI symptoms.  Previous records reviewed.   He has been having issues with recurrent UTIs.  Now he is having symptoms of urgency, mild dysuria and hematuria that are intermittent in nature.  Patient denies any modifying or aggravating factors.  Patient denies any suprapubic/flank pain.  Patient denies any fevers, chills, nausea or vomiting.   UA yellow slightly cloudy, specific gravity 1.020, 2+ heme, pH 5.5, nitrate positive, 1+ leukocytes, greater than 30 WBCs, 3-10 RBCs, 0-10 epithelial cells and many bacteria.  KUB bilateral nephrolithiasis   SHIM 5  Patient still having spontaneous erections.  He denies any pain or curvature with erections.  He did not take the tadalafil 20 mg on-demand dosing.  He and his wife have  been married for a long time and sexual activity is not a big focus.     SHIM     Row Name 07/09/23 [phone]         SHIM: Over the last 6 months:   How do you rate your confidence that you could get and keep an erection? Very Low     When you had erections with sexual stimulation, how often were your erections hard enough for penetration (entering your partner)? Almost Never or Never     During sexual intercourse, how often were you able to maintain your erection after you had penetrated (entered) your partner? Almost Never or Never     During sexual intercourse, how difficult was it to maintain your erection to completion of intercourse? Extremely Difficult     When you attempted sexual intercourse, how often was it satisfactory for you? Almost Never or Never       SHIM Total Score   SHIM 5              Score: 1-7 Severe ED 8-11 Moderate ED 12-16 Mild-Moderate ED 17-21 Mild ED 22-25 No ED   PMH: Past Medical History:  Diagnosis Date   Bradycardia    Chronic kidney disease    Hypertension    Nephrolithiasis    Obesity    Pure hypercholesterolemia, unspecified     Surgical History: Past Surgical History:  Procedure Laterality Date   APPENDECTOMY     COLONOSCOPY WITH PROPOFOL N/A 06/17/2016   Procedure: COLONOSCOPY WITH PROPOFOL;  Surgeon: Scot Jun, MD;  Location: [facility] ENDOSCOPY;  Service: Endoscopy;  Laterality: N/A;   lithrotripsy  Home Medications:  Allergies as of 07/09/2023   No Known Allergies      Medication List        Accurate as of July 09, 2023  9:36 AM. If you have any questions, ask your nurse or doctor.          aspirin 81 MG tablet Take 81 mg by mouth daily.   colchicine 0.6 MG tablet Take by mouth.   diphenhydrAMINE 25 mg capsule Commonly known as: BENADRYL Take 50 mg by mouth at bedtime.   gabapentin 300 MG capsule Commonly known as: NEURONTIN 1 po tid   hydrocortisone 2.5 % cream Apply Tue., Thur., Sat. at  bedtime as needed only.   ketoconazole 2 % cream Commonly known as: NIZORAL Apply M, W, F at bedtime to face as needed.   naproxen sodium 220 MG tablet Commonly known as: ALEVE Take 220 mg by mouth daily as needed.   olmesartan-hydrochlorothiazide 20-12.5 MG tablet Commonly known as: BENICAR HCT Take 1 tablet by mouth daily.   pravastatin 20 MG tablet Commonly known as: PRAVACHOL Take by mouth.   sulfamethoxazole-trimethoprim 800-160 MG tablet Commonly known as: BACTRIM DS Take 1 tablet by mouth every 12 (twelve) hours. Started by: Michiel Cowboy   tadalafil 20 MG tablet Commonly known as: CIALIS Take 1 tablet (20 mg total) by mouth daily as needed for erectile dysfunction (take 45 minutes prior to sexual activity).        Allergies: No Known Allergies  Family History: No family history on file.  Social History:  reports that he has never smoked. He has quit using smokeless tobacco. He reports current alcohol use of about 14.0 standard drinks of alcohol per week. He reports that he does not use drugs.  ROS: Pertinent ROS in HPI  Physical Exam: BP 119/74   Pulse (!) 58   Ht 5\' 10"  (1.778 m)   Wt 272 lb (123.4 kg)   BMI 39.03 kg/m   Constitutional:  Well nourished. Alert and oriented, No acute distress. HEENT: [place] AT, moist mucus membranes.  Trachea midline Cardiovascular: No clubbing, cyanosis, or edema. Respiratory: Normal respiratory effort, no increased work of breathing. Neurologic: Grossly intact, no focal deficits, moving all 4 extremities. Psychiatric: Normal mood and affect.  Laboratory Data: Lab Results  Component Value Date   WBC 11.3 (H) 06/12/2023   HGB 14.9 06/12/2023   HCT 44.0 06/12/2023   MCV 91.1 06/12/2023   PLT 138 (L) 06/12/2023   Lab Results  Component Value Date   CREATININE 1.22 06/12/2023   Lab Results  Component Value Date   TESTOSTERONE 318 07/07/2023   Lab Results  Component Value Date   AST 20 06/12/2023   Lab  Results  Component Value Date   ALT 22 06/12/2023   Urinalysis Results for orders placed or performed in visit on 07/09/23  Microscopic Examination   Urine  Result Value Ref Range   WBC, UA >30 (A) 0 - 5 /hpf   RBC, Urine 3-10 (A) 0 - 2 /hpf   Epithelial Cells (non renal) 0-10 0 - 10 /hpf   Bacteria, UA Many (A) None seen/Few  Urinalysis, Complete  Result Value Ref Range   Specific Gravity, UA 1.020 1.005 - 1.030   pH, UA 5.5 5.0 - 7.5   Color, UA Yellow Yellow   Appearance Ur Hazy (A) Clear   Leukocytes,UA 1+ (A) Negative   Protein,UA Negative Negative/Trace   Glucose, UA Negative Negative   Ketones, UA Negative Negative  RBC, UA 2+ (A) Negative   Bilirubin, UA Negative Negative   Urobilinogen, Ur 0.2 0.2 - 1.0 mg/dL   Nitrite, UA Positive (A) Negative   Microscopic Examination See below:     I have reviewed the labs.   Pertinent Imaging: N/A  Assessment & Plan:    1.  Erectile dysfunction -I discussed that his testosterone level was within the laboratory normal values and if he wanted to pursue testosterone replacement therapy it would likely have to be out-of-pocket cost, he is not having significant symptoms of hypogonadism.  I did express that testosterone placement therapy is not indicated as a treatment for erectile dysfunction and it is highly likely he could get his testosterone levels up and still have issues with erectile dysfunction.  He expressed that he feels well and does not have symptoms of hypogonadism and therefore he will defer on any testosterone replacement therapy -He has the Cialis 20 mg on-demand dosing on hand and will try it in the future   2. Nephrolithiasis -UA w/ micro heme  -Urine culture pending -I recommended that we repeat the CT renal stone study at this time to evaluate for specific stone burden and to ensure the left ureteral stone to rule that out as a nidus and he is in agreement  -repeat CT ordered  -I will contact with  results  3. Suspected UTI -UA nitrate positive, with pyuria, with hematuria and bacteria -urine culture pending  -Septra sent to pharmacy, will adjust if necessary once culture results are available   Return for I will call patient with results.  These notes generated with voice recognition software. I apologize for typographical errors.  Cloretta Ned  [facility] [address]  [address] [place], [place] [address] [phone]

## 2023-07-09 ENCOUNTER — Ambulatory Visit
Admission: RE | Admit: 2023-07-09 | Discharge: 2023-07-09 | Disposition: A | Discharge status: Home / Self Care | Payer: Medicare Other | Attending: Urology | Admitting: Urology

## 2023-07-09 ENCOUNTER — Other Ambulatory Visit: Payer: Self-pay | Admitting: Urology

## 2023-07-09 ENCOUNTER — Other Ambulatory Visit: Payer: Self-pay

## 2023-07-09 ENCOUNTER — Ambulatory Visit
Admission: RE | Admit: 2023-07-09 | Discharge: 2023-07-09 | Disposition: A | Discharge status: Home / Self Care | Payer: Medicare Other | Source: Ambulatory Visit | Attending: Urology | Admitting: Urology

## 2023-07-09 ENCOUNTER — Ambulatory Visit (INDEPENDENT_AMBULATORY_CARE_PROVIDER_SITE_OTHER): Payer: Medicare Other | Admitting: Urology

## 2023-07-09 ENCOUNTER — Encounter: Payer: Self-pay | Admitting: Urology

## 2023-07-09 VITALS — BP 119/74 | HR 58 | Ht 70.0 in | Wt 272.0 lb

## 2023-07-09 DIAGNOSIS — R3989 Other symptoms and signs involving the genitourinary system: Secondary | ICD-10-CM | POA: Diagnosis not present

## 2023-07-09 DIAGNOSIS — N529 Male erectile dysfunction, unspecified: Secondary | ICD-10-CM | POA: Diagnosis not present

## 2023-07-09 DIAGNOSIS — N200 Calculus of kidney: Secondary | ICD-10-CM

## 2023-07-09 LAB — URINALYSIS, COMPLETE
Bilirubin, UA: NEGATIVE
Glucose, UA: NEGATIVE
Ketones, UA: NEGATIVE
Nitrite, UA: POSITIVE — AB
Protein,UA: NEGATIVE
Specific Gravity, UA: 1.02 (ref 1.005–1.030)
Urobilinogen, Ur: 0.2 mg/dL (ref 0.2–1.0)
pH, UA: 5.5 (ref 5.0–7.5)

## 2023-07-09 LAB — MICROSCOPIC EXAMINATION: WBC, UA: >30 /hpf — AB (ref 0–5)

## 2023-07-09 MED ORDER — SULFAMETHOXAZOLE-TRIMETHOPRIM 800-160 MG PO TABS
1.0000 | ORAL_TABLET | Freq: Two times a day (BID) | ORAL | 0 refills | Status: DC
Start: 2023-07-09 — End: 2023-07-15

## 2023-07-15 ENCOUNTER — Other Ambulatory Visit: Payer: Self-pay | Admitting: Urology

## 2023-07-15 ENCOUNTER — Telehealth: Payer: Self-pay | Admitting: Urology

## 2023-07-15 DIAGNOSIS — R3989 Other symptoms and signs involving the genitourinary system: Secondary | ICD-10-CM

## 2023-07-15 LAB — CULTURE, URINE COMPREHENSIVE

## 2023-07-15 MED ORDER — CIPROFLOXACIN HCL 500 MG PO TABS: 500.0000 mg | ORAL_TABLET | Freq: Two times a day (BID) | ORAL | 0 refills | Status: DC

## 2023-07-15 MED ORDER — SULFAMETHOXAZOLE-TRIMETHOPRIM 800-160 MG PO TABS
1.0000 | ORAL_TABLET | Freq: Two times a day (BID) | ORAL | 0 refills | Status: DC
Start: 2023-07-15 — End: 2023-07-23

## 2023-07-15 NOTE — Telephone Encounter (Signed)
Patient called and lvm stating that he has one pill left of his prescription, and he still feels like he has UTI. He is asking if he can get a refill or come in today or tomorrow to leave a urine sample. He is going out of town on Friday for a funeral, and wants to get medication before the weekend.

## 2023-07-15 NOTE — Telephone Encounter (Signed)
Spoke with patient and advised results   

## 2023-07-16 ENCOUNTER — Ambulatory Visit
Admission: RE | Admit: 2023-07-16 | Discharge: 2023-07-16 | Disposition: A | Discharge status: Home / Self Care | Payer: Medicare Other | Source: Ambulatory Visit | Attending: Urology | Admitting: Urology

## 2023-07-16 DIAGNOSIS — N200 Calculus of kidney: Secondary | ICD-10-CM | POA: Insufficient documentation

## 2023-07-17 ENCOUNTER — Other Ambulatory Visit: Payer: Self-pay

## 2023-07-17 ENCOUNTER — Other Ambulatory Visit: Payer: Self-pay | Admitting: Urology

## 2023-07-17 ENCOUNTER — Telehealth: Payer: Self-pay | Admitting: Urology

## 2023-07-17 DIAGNOSIS — N201 Calculus of ureter: Secondary | ICD-10-CM

## 2023-07-17 DIAGNOSIS — N200 Calculus of kidney: Secondary | ICD-10-CM

## 2023-07-17 DIAGNOSIS — R3989 Other symptoms and signs involving the genitourinary system: Secondary | ICD-10-CM

## 2023-07-17 MED ORDER — CIPROFLOXACIN HCL 500 MG PO TABS
500.0000 mg | ORAL_TABLET | Freq: Two times a day (BID) | ORAL | 0 refills | Status: DC
Start: 2023-07-17 — End: 2023-08-05

## 2023-07-17 NOTE — Progress Notes (Signed)
Surgical Physician Order Form [facility]  * Scheduling expectation :  07/25/2023 w/ Dr. Richardo Hanks   *Length of Case:   *Clearance needed: no  *Anticoagulation Instructions: Hold all anticoagulants  *Aspirin Instructions: Hold Aspirin  *Post-op visit Date/Instructions:   TBD  *Diagnosis: Left Ureteral Stone and Left Renal Stone   *Procedure: left Ureteroscopy w/laser lithotripsy & stent placement ([address])   Additional orders: N/A  -Admit type: OUTpatient  -Anesthesia: General  -VTE Prophylaxis Standing Order SCD's       Other:   -Standing Lab Orders Per Anesthesia    Lab other: None  -Standing Test orders EKG/Chest x-ray per Anesthesia       Test other:   - Medications:  Ancef 2gm IV  -Other orders:  N/A

## 2023-07-17 NOTE — Telephone Encounter (Signed)
Spoke with Louis Jackson regarding his CT scan results and Dr. Keane Scrape recommendations to have Left URS/LL/ureteral stent placement.  I explained the procedure to Louis Jackson and described the stent and risks involved.  He is in agreement and wishes to proceed.   I will also extend his Cipro to cover him until his surgery.    Surgical Physician Order Form [facility] [place]  * Scheduling expectation :  07/25/2023 w/ Dr. Richardo Hanks   *Length of Case:   *Clearance needed: no  *Anticoagulation Instructions: Hold all anticoagulants  *Aspirin Instructions: Hold Aspirin  *Post-op visit Date/Instructions:   TBD  *Diagnosis: Left Ureteral Stone and Left Renal Stone   *Procedure: left Ureteroscopy w/laser lithotripsy & stent placement ([address])   Additional orders: N/A  -Admit type: OUTpatient  -Anesthesia: General  -VTE Prophylaxis Standing Order SCD's       Other:   -Standing Lab Orders Per Anesthesia    Lab other: None  -Standing Test orders EKG/Chest x-ray per Anesthesia       Test other:   - Medications:  Ancef 2gm IV  -Other orders:  N/A

## 2023-07-18 ENCOUNTER — Telehealth: Payer: Self-pay

## 2023-07-18 NOTE — Progress Notes (Signed)
   [facility] Posting Form  Surgery Date: Date: 07/25/2023  Surgeon: Dr. Legrand Rams, MD  Inpt ( No  )   Outpt (Yes)   Obs ( No  )   Diagnosis: N20.1 Left Ureteral Stone, N20.0 Left Nephrolithiasis  -CPT: [phone]  Surgery: Left Ureteroscopy with Laser Lithotripsy and Stent Placement   Stop Anticoagulations: No and may continue ASA  Cardiac/Medical/Pulmonary Clearance needed: no  *Orders entered into EPIC  Date: 07/18/23   *Case booked in [place]  Date: 07/17/2023  *Notified pt of Surgery: Date: 07/17/2023  PRE-OP UA & CX: no  *Placed into Prior Authorization Work Que Date: 07/18/23  Assistant/laser/rep:No

## 2023-07-18 NOTE — Telephone Encounter (Signed)
Per Dr. Richardo Hanks, Patient is to be scheduled for Left Ureteroscopy with Laser Lithotripsy and Stent Placement   Louis Jackson was contacted and possible surgical dates were discussed, Friday September 13th, 2024 was agreed upon for surgery.   Patient was directed to call [phone] between 1-3pm the day before surgery to find out surgical arrival time.  Instructions were given not to eat or drink from midnight on the night before surgery and have a driver for the day of surgery. On the surgery day patient was instructed to enter through the [facility] entrance of [facility] report the Same Day Surgery desk.   Pre-Admit Testing will be in contact via phone to set up an interview with the anesthesia team to review your history and medications prior to surgery.   Reminder of this information was sent via MyChart to the patient.

## 2023-07-23 ENCOUNTER — Encounter
Admission: RE | Admit: 2023-07-23 | Discharge: 2023-07-23 | Disposition: A | Discharge status: Home / Self Care | Payer: Medicare Other | Source: Ambulatory Visit | Attending: Urology | Admitting: Urology

## 2023-07-23 VITALS — Ht 70.0 in | Wt 271.8 lb

## 2023-07-23 DIAGNOSIS — Z79899 Other long term (current) drug therapy: Secondary | ICD-10-CM

## 2023-07-23 DIAGNOSIS — I10 Essential (primary) hypertension: Secondary | ICD-10-CM

## 2023-07-23 DIAGNOSIS — Z01812 Encounter for preprocedural laboratory examination: Secondary | ICD-10-CM

## 2023-07-23 DIAGNOSIS — Z01810 Encounter for preprocedural cardiovascular examination: Secondary | ICD-10-CM

## 2023-07-23 HISTORY — DX: Prediabetes: R73.03

## 2023-07-23 HISTORY — DX: Obstructive sleep apnea (adult) (pediatric): G47.33

## 2023-07-23 HISTORY — DX: Gastro-esophageal reflux disease without esophagitis: K21.9

## 2023-07-23 HISTORY — DX: Spinal stenosis, lumbar region without neurogenic claudication: M48.061

## 2023-07-23 HISTORY — DX: Gout, unspecified: M10.9

## 2023-07-23 HISTORY — DX: Personal history of urinary calculi: Z87.442

## 2023-07-23 HISTORY — DX: Cyst of kidney, acquired: N28.1

## 2023-07-23 HISTORY — DX: Inflammatory disease of prostate, unspecified: N41.9

## 2023-07-23 NOTE — Patient Instructions (Signed)
Your procedure is scheduled on:07-25-23 Friday Report to the Registration Desk on the 1st floor of the [facility].Then proceed to the 2nd floor Surgery Desk To find out your arrival time, please call [phone] between 1PM - 3PM on:07-24-23 Thursday If your arrival time is 6:00 am, do not arrive before that time as the [facility] entrance doors do not open until 6:00 am.  REMEMBER: Instructions that are not followed completely may result in serious medical risk, up to and including death; or upon the discretion of your surgeon and anesthesiologist your surgery may need to be rescheduled.  Do not eat food OR drink any liquids after midnight the night before surgery.  No gum chewing or hard candies.  One week prior to surgery:Last dose 07-23-23 Stop Anti-inflammatories (NSAIDS) such as Advil, Aleve, Ibuprofen, Motrin, Naproxen, Naprosyn and Aspirin based products such as Excedrin, Goody's Powder, BC Powder.You may however, take Tylenol if needed for pain up until the day of surgery. Stop ANY OVER THE COUNTER supplements/vitamins NOW (07-23-23) until after surgery.   Continue taking all prescribed medications with the exception of the following: -tadalafil (CIALIS)-Stop NOW (07-23-23)  TAKE ONLY THESE MEDICATIONS THE MORNING OF SURGERY WITH A SIP OF WATER: -gabapentin (NEURONTIN)   Continue your 81 mg Aspirin up until the day prior to surgery-Do NOT take the morning of surgery  No Alcohol for 24 hours before or after surgery.  No Smoking including e-cigarettes for 24 hours before surgery.  No chewable tobacco products for at least 6 hours before surgery.  No nicotine patches on the day of surgery.  Do not use any "recreational" drugs for at least a week (preferably 2 weeks) before your surgery.  Please be advised that the combination of cocaine and anesthesia may have negative outcomes, up to and including death. If you test positive for cocaine, your surgery will be cancelled.  On  the morning of surgery brush your teeth with toothpaste and water, you may rinse your mouth with mouthwash if you wish. Do not swallow any toothpaste or mouthwash.  Do not wear jewelry, make-up, hairpins, clips or nail polish.  For welded (permanent) jewelry: bracelets, anklets, waist bands, etc.  Please have this removed prior to surgery.  If it is not removed, there is a chance that hospital personnel will need to cut it off on the day of surgery.  Do not wear lotions, powders, or perfumes.   Do not shave body hair from the neck down 48 hours before surgery.  Contact lenses, hearing aids and dentures may not be worn into surgery.  Do not bring valuables to the hospital. [facility] is not responsible for any missing/lost belongings or valuables.   Bring your C-PAP to the hospital   Notify your doctor if there is any change in your medical condition (cold, fever, infection).  Wear comfortable clothing (specific to your surgery type) to the hospital.  After surgery, you can help prevent lung complications by doing breathing exercises.  Take deep breaths and cough every 1-2 hours. Your doctor may order a device called an Incentive Spirometer to help you take deep breaths. When coughing or sneezing, hold a pillow firmly against your incision with both hands. This is called "splinting." Doing this helps protect your incision. It also decreases belly discomfort.  If you are being admitted to the hospital overnight, leave your suitcase in the car. After surgery it may be brought to your room.  In case of increased patient census, it may be  necessary for you, the patient, to continue your postoperative care in the Same Day Surgery department.  If you are being discharged the day of surgery, you will not be allowed to drive home. You will need a responsible individual to drive you home and stay with you for 24 hours after surgery.   If you are taking public transportation, you will need to  have a responsible individual with you.  Please call the Pre-admissions Testing Dept. at [phone] if you have any questions about these instructions.  Surgery Visitation Policy:  Patients having surgery or a procedure may have two visitors.  Children under the age of 55 must have an adult with them who is not the patient.

## 2023-07-24 ENCOUNTER — Encounter
Admission: RE | Admit: 2023-07-24 | Discharge: 2023-07-24 | Disposition: A | Discharge status: Home / Self Care | Payer: Medicare Other | Source: Ambulatory Visit | Attending: Urology | Admitting: Urology

## 2023-07-24 DIAGNOSIS — Z79899 Other long term (current) drug therapy: Secondary | ICD-10-CM | POA: Insufficient documentation

## 2023-07-24 DIAGNOSIS — R9431 Abnormal electrocardiogram [ECG] [EKG]: Secondary | ICD-10-CM | POA: Diagnosis not present

## 2023-07-24 DIAGNOSIS — I10 Essential (primary) hypertension: Secondary | ICD-10-CM | POA: Insufficient documentation

## 2023-07-24 DIAGNOSIS — Z01810 Encounter for preprocedural cardiovascular examination: Secondary | ICD-10-CM | POA: Insufficient documentation

## 2023-07-24 DIAGNOSIS — Z01812 Encounter for preprocedural laboratory examination: Secondary | ICD-10-CM | POA: Insufficient documentation

## 2023-07-24 DIAGNOSIS — Z01818 Encounter for other preprocedural examination: Secondary | ICD-10-CM | POA: Diagnosis present

## 2023-07-24 LAB — BASIC METABOLIC PANEL
Anion gap: 10 (ref 5–15)
BUN: 21 mg/dL (ref 8–23)
CO2: 23 mmol/L (ref 22–32)
Calcium: 9.2 mg/dL (ref 8.9–10.3)
Chloride: 106 mmol/L (ref 98–111)
Creatinine, Ser: 1.18 mg/dL (ref 0.61–1.24)
GFR, Estimated: >60 mL/min (ref >60)
Glucose, Bld: 105 mg/dL — ABNORMAL HIGH (ref 70–99)
Potassium: 4 mmol/L (ref 3.5–5.1)
Sodium: 139 mmol/L (ref 135–145)

## 2023-07-24 MED ORDER — CEFAZOLIN SODIUM-DEXTROSE 2-4 GM/100ML-% IV SOLN
2.0000 g | INTRAVENOUS | Status: AC
  Administered 2023-07-25: 3 g via INTRAVENOUS

## 2023-07-24 MED ORDER — FAMOTIDINE 20 MG PO TABS
20.0000 mg | ORAL_TABLET | Freq: Once | ORAL | Status: AC
  Administered 2023-07-25: 20 mg via ORAL

## 2023-07-24 MED ORDER — LACTATED RINGERS IV SOLN: INTRAVENOUS | Status: DC

## 2023-07-24 MED ORDER — ORAL CARE MOUTH RINSE: 15.0000 mL | Freq: Once | OROMUCOSAL | Status: AC

## 2023-07-24 MED ORDER — CHLORHEXIDINE GLUCONATE 0.12 % MT SOLN
15.0000 mL | Freq: Once | OROMUCOSAL | Status: AC
  Administered 2023-07-25: 15 mL via OROMUCOSAL

## 2023-07-25 ENCOUNTER — Encounter: Payer: Self-pay | Admitting: Urology

## 2023-07-25 ENCOUNTER — Ambulatory Visit: Payer: Medicare Other

## 2023-07-25 ENCOUNTER — Encounter
Admission: RE | Disposition: A | Discharge status: Home / Self Care | Payer: Self-pay | Source: Home / Self Care | Attending: Urology

## 2023-07-25 ENCOUNTER — Ambulatory Visit
Admission: RE | Admit: 2023-07-25 | Discharge: 2023-07-25 | Disposition: A | Discharge status: Home / Self Care | Payer: Medicare Other | Attending: Urology | Admitting: Urology

## 2023-07-25 ENCOUNTER — Other Ambulatory Visit: Payer: Self-pay

## 2023-07-25 ENCOUNTER — Ambulatory Visit: Payer: Medicare Other | Admitting: Registered Nurse

## 2023-07-25 DIAGNOSIS — Z87440 Personal history of urinary (tract) infections: Secondary | ICD-10-CM | POA: Diagnosis not present

## 2023-07-25 DIAGNOSIS — Z79899 Other long term (current) drug therapy: Secondary | ICD-10-CM | POA: Diagnosis not present

## 2023-07-25 DIAGNOSIS — G709 Myoneural disorder, unspecified: Secondary | ICD-10-CM | POA: Insufficient documentation

## 2023-07-25 DIAGNOSIS — N200 Calculus of kidney: Secondary | ICD-10-CM

## 2023-07-25 DIAGNOSIS — G4730 Sleep apnea, unspecified: Secondary | ICD-10-CM | POA: Diagnosis not present

## 2023-07-25 DIAGNOSIS — N202 Calculus of kidney with calculus of ureter: Secondary | ICD-10-CM | POA: Diagnosis present

## 2023-07-25 DIAGNOSIS — Z6839 Body mass index (BMI) 39.0-39.9, adult: Secondary | ICD-10-CM | POA: Diagnosis not present

## 2023-07-25 DIAGNOSIS — I129 Hypertensive chronic kidney disease with stage 1 through stage 4 chronic kidney disease, or unspecified chronic kidney disease: Secondary | ICD-10-CM | POA: Insufficient documentation

## 2023-07-25 DIAGNOSIS — Z87891 Personal history of nicotine dependence: Secondary | ICD-10-CM | POA: Diagnosis not present

## 2023-07-25 DIAGNOSIS — N529 Male erectile dysfunction, unspecified: Secondary | ICD-10-CM | POA: Insufficient documentation

## 2023-07-25 DIAGNOSIS — N189 Chronic kidney disease, unspecified: Secondary | ICD-10-CM | POA: Insufficient documentation

## 2023-07-25 DIAGNOSIS — N201 Calculus of ureter: Secondary | ICD-10-CM

## 2023-07-25 HISTORY — PX: CYSTOSCOPY/URETEROSCOPY/HOLMIUM LASER/STENT PLACEMENT: SHX6546

## 2023-07-25 SURGERY — CYSTOSCOPY/URETEROSCOPY/HOLMIUM LASER/STENT PLACEMENT
Anesthesia: General | Laterality: Left

## 2023-07-25 MED ORDER — CEFAZOLIN SODIUM-DEXTROSE 2-4 GM/100ML-% IV SOLN
INTRAVENOUS | Status: AC
  Filled 2023-07-25: qty 100

## 2023-07-25 MED ORDER — DEXAMETHASONE SODIUM PHOSPHATE 10 MG/ML IJ SOLN
INTRAMUSCULAR | Status: DC | PRN
  Administered 2023-07-25: 10 mg via INTRAVENOUS

## 2023-07-25 MED ORDER — MIDAZOLAM HCL 2 MG/2ML IJ SOLN
INTRAMUSCULAR | Status: DC | PRN
  Administered 2023-07-25: 2 mg via INTRAVENOUS

## 2023-07-25 MED ORDER — DEXAMETHASONE SODIUM PHOSPHATE 10 MG/ML IJ SOLN
INTRAMUSCULAR | Status: AC
  Filled 2023-07-25: qty 1

## 2023-07-25 MED ORDER — ROCURONIUM BROMIDE 10 MG/ML (PF) SYRINGE
PREFILLED_SYRINGE | INTRAVENOUS | Status: AC
  Filled 2023-07-25: qty 10

## 2023-07-25 MED ORDER — PROPOFOL 10 MG/ML IV BOLUS
INTRAVENOUS | Status: DC | PRN
  Administered 2023-07-25: 200 mg via INTRAVENOUS

## 2023-07-25 MED ORDER — FENTANYL CITRATE (PF) 100 MCG/2ML IJ SOLN: 25.0000 ug | INTRAMUSCULAR | Status: DC | PRN

## 2023-07-25 MED ORDER — MIDAZOLAM HCL 2 MG/2ML IJ SOLN
INTRAMUSCULAR | Status: AC
  Filled 2023-07-25: qty 2

## 2023-07-25 MED ORDER — SUGAMMADEX SODIUM 200 MG/2ML IV SOLN
INTRAVENOUS | Status: DC | PRN
  Administered 2023-07-25: 493.2 mg via INTRAVENOUS

## 2023-07-25 MED ORDER — TAMSULOSIN HCL 0.4 MG PO CAPS: 0.4000 mg | ORAL_CAPSULE | Freq: Every day | ORAL | 0 refills | Status: AC

## 2023-07-25 MED ORDER — ONDANSETRON HCL 4 MG/2ML IJ SOLN
INTRAMUSCULAR | Status: DC | PRN
  Administered 2023-07-25: 4 mg via INTRAVENOUS

## 2023-07-25 MED ORDER — PROPOFOL 1000 MG/100ML IV EMUL
INTRAVENOUS | Status: AC
  Filled 2023-07-25: qty 100

## 2023-07-25 MED ORDER — FENTANYL CITRATE (PF) 100 MCG/2ML IJ SOLN
INTRAMUSCULAR | Status: DC | PRN
  Administered 2023-07-25: 100 ug via INTRAVENOUS

## 2023-07-25 MED ORDER — LIDOCAINE HCL (PF) 2 % IJ SOLN
INTRAMUSCULAR | Status: AC
  Filled 2023-07-25: qty 5

## 2023-07-25 MED ORDER — FAMOTIDINE 20 MG PO TABS
ORAL_TABLET | ORAL | Status: AC
  Filled 2023-07-25: qty 1

## 2023-07-25 MED ORDER — IOHEXOL 180 MG/ML  SOLN
INTRAMUSCULAR | Status: DC | PRN
  Administered 2023-07-25: 10 mL

## 2023-07-25 MED ORDER — SODIUM CHLORIDE 0.9 % IR SOLN
Status: DC | PRN
  Administered 2023-07-25: 1

## 2023-07-25 MED ORDER — ROCURONIUM BROMIDE 100 MG/10ML IV SOLN
INTRAVENOUS | Status: DC | PRN
  Administered 2023-07-25: 50 mg via INTRAVENOUS

## 2023-07-25 MED ORDER — CEPHALEXIN 500 MG PO CAPS: 500.0000 mg | ORAL_CAPSULE | Freq: Every day | ORAL | 0 refills | Status: DC

## 2023-07-25 MED ORDER — CHLORHEXIDINE GLUCONATE 0.12 % MT SOLN
OROMUCOSAL | Status: AC
  Filled 2023-07-25: qty 15

## 2023-07-25 MED ORDER — PROPOFOL 10 MG/ML IV BOLUS
INTRAVENOUS | Status: AC
  Filled 2023-07-25: qty 20

## 2023-07-25 MED ORDER — ONDANSETRON HCL 4 MG/2ML IJ SOLN
INTRAMUSCULAR | Status: AC
  Filled 2023-07-25: qty 2

## 2023-07-25 MED ORDER — OXYCODONE-ACETAMINOPHEN 5-325 MG PO TABS
1.0000 | ORAL_TABLET | Freq: Four times a day (QID) | ORAL | 0 refills | Status: AC | PRN
Start: 2023-07-25 — End: 2024-07-24

## 2023-07-25 MED ORDER — LIDOCAINE HCL (CARDIAC) PF 100 MG/5ML IV SOSY
PREFILLED_SYRINGE | INTRAVENOUS | Status: DC | PRN
  Administered 2023-07-25: 100 mg via INTRAVENOUS

## 2023-07-25 MED ORDER — OXYCODONE HCL 5 MG/5ML PO SOLN: 5.0000 mg | Freq: Once | ORAL | Status: DC | PRN

## 2023-07-25 MED ORDER — FENTANYL CITRATE (PF) 100 MCG/2ML IJ SOLN
INTRAMUSCULAR | Status: AC
  Filled 2023-07-25: qty 2

## 2023-07-25 MED ORDER — OXYCODONE HCL 5 MG PO TABS: 5.0000 mg | ORAL_TABLET | Freq: Once | ORAL | Status: DC | PRN

## 2023-07-25 SURGICAL SUPPLY — 20 items
BAG DRAIN SIEMENS DORNER NS (MISCELLANEOUS) ×1 IMPLANT
BAG DRN NS LF (MISCELLANEOUS) ×1
BAG PRESSURE INF REUSE 3000 (BAG) ×1 IMPLANT
BRUSH SCRUB EZ 4% CHG (MISCELLANEOUS) IMPLANT
DRAPE UTILITY 15X26 TOWEL STRL (DRAPES) ×1 IMPLANT
GLOVE BIOGEL PI IND STRL 7.5 (GLOVE) ×1 IMPLANT
GOWN STRL REUS W/ TWL LRG LVL3 (GOWN DISPOSABLE) ×1 IMPLANT
GOWN STRL REUS W/ TWL XL LVL3 (GOWN DISPOSABLE) ×1 IMPLANT
GOWN STRL REUS W/TWL LRG LVL3 (GOWN DISPOSABLE) ×1
GOWN STRL REUS W/TWL XL LVL3 (GOWN DISPOSABLE) ×1
GUIDEWIRE STR DUAL SENSOR (WIRE) ×1 IMPLANT
IV NS IRRIG 3000ML ARTHROMATIC (IV SOLUTION) ×1 IMPLANT
KIT TURNOVER CYSTO (KITS) ×1 IMPLANT
PACK CYSTO AR (MISCELLANEOUS) ×1 IMPLANT
SET CYSTO W/LG BORE CLAMP LF (SET/KITS/TRAYS/PACK) ×1 IMPLANT
STENT URET 6FRX28 CONTOUR (STENTS) IMPLANT
SURGILUBE 2OZ TUBE FLIPTOP (MISCELLANEOUS) ×1 IMPLANT
SYR 10ML LL (SYRINGE) ×1 IMPLANT
VALVE UROSEAL ADJ ENDO (VALVE) IMPLANT
WATER STERILE IRR 500ML POUR (IV SOLUTION) ×1 IMPLANT

## 2023-07-25 NOTE — Anesthesia Procedure Notes (Signed)
Procedure Name: Intubation Date/Time: 07/25/2023 10:09 AM  Performed by: Emeterio Reeve, CRNAPre-anesthesia Checklist: Patient identified, Emergency Drugs available, Suction available and Patient being monitored Patient Re-evaluated:Patient Re-evaluated prior to induction Oxygen Delivery Method: Circle system utilized Preoxygenation: Pre-oxygenation with 100% oxygen Induction Type: IV induction Ventilation: Mask ventilation without difficulty Laryngoscope Size: McGraph and 4 Grade View: Grade II Tube type: Oral Tube size: 7.5 mm Number of attempts: 1 Airway Equipment and Method: Stylet and Oral airway Placement Confirmation: ETT inserted through vocal cords under direct vision, positive ETCO2 and breath sounds checked- equal and bilateral Secured at: 22 cm Tube secured with: Tape Dental Injury: Teeth and Oropharynx as per pre-operative assessment  Comments: Cords clear, no trauma. CA

## 2023-07-25 NOTE — Anesthesia Postprocedure Evaluation (Signed)
Anesthesia Post Note  Patient: Louis Jackson  Procedure(s) Performed: CYSTOSCOPY/URETEROSCOPY/HOLMIUM LASER/STENT PLACEMENT (Left)  Patient location during evaluation: PACU Anesthesia Type: General Level of consciousness: awake and alert Pain management: pain level controlled Vital Signs Assessment: post-procedure vital signs reviewed and stable Respiratory status: spontaneous breathing, nonlabored ventilation, respiratory function stable and patient connected to nasal cannula oxygen Cardiovascular status: blood pressure returned to baseline and stable Postop Assessment: no apparent nausea or vomiting Anesthetic complications: no   No notable events documented.   Last Vitals:  Vitals:   07/25/23 1135 07/25/23 1143  BP:  (!) 159/91  Pulse: (!) 47 (!) 44  Resp: 15 16  Temp:  (!) 35.9 C  SpO2: 98% 98%    Last Pain:  Vitals:   07/25/23 1143  TempSrc: Oral  PainSc: 3                  [name]

## 2023-07-25 NOTE — Transfer of Care (Signed)
Immediate Anesthesia Transfer of Care Note  Patient: Louis Jackson  Procedure(s) Performed: CYSTOSCOPY/URETEROSCOPY/HOLMIUM LASER/STENT PLACEMENT (Left)  Patient Location: PACU  Anesthesia Type:General  Level of Consciousness: drowsy and patient cooperative  Airway & Oxygen Therapy: Patient Spontanous Breathing and Patient connected to face mask oxygen  Post-op Assessment: Report given to RN and Post -op Vital signs reviewed and stable  Post vital signs: stable  Last Vitals:  Vitals Value Taken Time  BP 140/87 07/25/23 1057  Temp 36.1 C 07/25/23 1058  Pulse 51 07/25/23 1059  Resp    SpO2 100 % 07/25/23 1059  Vitals shown include unfiled device data.  Last Pain:  Vitals:   07/25/23 1058  TempSrc:   PainSc: Asleep         Complications: No notable events documented.

## 2023-07-25 NOTE — Discharge Instructions (Signed)
AMBULATORY SURGERY  ?DISCHARGE INSTRUCTIONS ? ? ?The drugs that you were given will stay in your system until tomorrow so for the next 24 hours you should not: ? ?Drive an automobile ?Make any legal decisions ?Drink any alcoholic beverage ? ? ?You may resume regular meals tomorrow.  Today it is better to start with liquids and gradually work up to solid foods. ? ?You may eat anything you prefer, but it is better to start with liquids, then soup and crackers, and gradually work up to solid foods. ? ? ?Please notify your doctor immediately if you have any unusual bleeding, trouble breathing, redness and pain at the surgery site, drainage, fever, or pain not relieved by medication. ? ? ? ?Additional Instructions: ? ? ? ?Please contact your physician with any problems or Same Day Surgery at [phone], Monday through Friday 6 am to 4 pm, or [place] at [facility] number at [phone].  ?

## 2023-07-25 NOTE — Op Note (Signed)
Date of procedure: 07/25/23  Preoperative diagnosis:  Left ureteral stone Left renal stone  Postoperative diagnosis:  Same  Procedure: Cystoscopy, left retrograde pyelogram with intraoperative interpretation, left ureteral stent placement Left ureteroscopy and laser lithotripsy of ureteral stone Left ureteroscopy and laser lithotripsy of renal stone  Surgeon: Legrand Rams, MD  Anesthesia: General  Complications: None  Intraoperative findings:  6 mm left distal ureteral stone dusted and irrigated free Large 1.3 cm left renal stone dusted Uncomplicated stent placement  EBL: Minimal  Specimens: None  Drains: Left 6 French by 28 cm ureteral stent  Indication: Louis Jackson is a 67 y.o. patient with left ureteral stone and large left renal stone who opted for ureteroscopy.  After reviewing the management options for treatment, they elected to proceed with the above surgical procedure(s). We have discussed the potential benefits and risks of the procedure, side effects of the proposed treatment, the likelihood of the patient achieving the goals of the procedure, and any potential problems that might occur during the procedure or recuperation. Informed consent has been obtained.  Description of procedure:  The patient was taken to the operating room and general anesthesia was induced. SCDs were placed for DVT prophylaxis. The patient was placed in the dorsal lithotomy position, prepped and draped in the usual sterile fashion, and preoperative antibiotics were administered. A preoperative time-out was performed.   A 21 French rigid cystoscope was used intubate the urethra and a normal-appearing urethra was followed proximally into the bladder.  The prostate was moderate in size and there was a high bladder neck.  Thorough inspection of the bladder showed no abnormalities, and ureteral orifices were orthotopic bilaterally.  A sensor wire advanced easily into the left ureteral  orifice and up to the kidney under fluoroscopic vision.  A semirigid long ureteroscope was advanced alongside the wire, and a 6 mm stone was identified in the distal ureter.  A 360 m laser fiber on settings of 1.0 J and 10 Hz was used to methodically fragment the stone, and pieces were irrigated free from the ureter.  Stone appeared to be calcium oxalate.  A digital single-channel flexible ureteroscope was then advanced over the wire up to the kidney under fluoroscopic vision. Pyeloscopy showed a large 1.3 cm black calcium oxalate appearing stone in the mid to upper pole.  The 360 m laser fiber on settings of 0.5 J and 80 Hz was used to methodically dust the stone.  Thorough pyeloscopy showed no fragments larger than the laser fiber.  A retrograde pyelogram was performed from the proximal ureter and showed no extravasation or filling defects.  The wire was replaced through the scope.  Careful pullback ureteroscopy showed no other ureteral abnormalities or residual stone fragments.  A rigid cystoscope was backloaded over the wire and a 6 [place] by 28 cm ureteral stent was uneventfully placed.  There appeared to be a little bit of extra stent curl in the upper pole.  A longer stent was chosen based on the course of his ureter with the large left renal cyst.  Fluid drained through the side ports of the stent.  The bladder was drained and this concluded our procedure  Disposition: Stable to PACU  Plan: Stent removal in 10 to 14 days Keflex prophylaxis with stent in place Will need 24-hour urine metabolic workup and follow-up  Legrand Rams, MD

## 2023-07-25 NOTE — Interval H&P Note (Signed)
UROLOGY H&P UPDATE  Agree with prior H&P dated 07/09/2023 by Michiel Cowboy, PA.  67 year old male with persistent 7 mm left distal ureteral stone  Cardiac: RRR Lungs: CTA bilaterally  Laterality: Left Procedure: Ureteroscopy, laser lithotripsy, stent placement  Urine: Culture 8/28 with E. coli, previously treated with culture appropriate antibiotics  We specifically discussed the risks ureteroscopy including bleeding, infection/sepsis, stent related symptoms including flank pain/urgency/frequency/incontinence/dysuria, ureteral injury, inability to access stone, or need for staged or additional procedures.   Sondra Come, MD 07/25/2023

## 2023-07-25 NOTE — Anesthesia Preprocedure Evaluation (Signed)
Anesthesia Evaluation  Patient identified by MRN, date of birth, ID band Patient awake    Reviewed: Allergy & Precautions, NPO status , Patient's Chart, lab work & pertinent test results  History of Anesthesia Complications Negative for: history of anesthetic complications  Airway Mallampati: III  TM Distance: >3 FB Neck ROM: full    Dental no notable dental hx.    Pulmonary sleep apnea , former smoker   Pulmonary exam normal        Cardiovascular hypertension, On Medications negative cardio ROS Normal cardiovascular exam     Neuro/Psych  Neuromuscular disease  negative psych ROS   GI/Hepatic Neg liver ROS,GERD  Medicated,,  Endo/Other    Morbid obesity  Renal/GU Renal disease     Musculoskeletal   Abdominal   Peds  Hematology negative hematology ROS (+)   Anesthesia Other Findings Past Medical History: No date: Bradycardia No date: Chronic kidney disease No date: GERD (gastroesophageal reflux disease)     Comment:  h/o No date: Gout No date: History of kidney stones No date: Hypertension No date: Lumbar stenosis No date: Nephrolithiasis No date: Obesity No date: OSA on CPAP No date: Pre-diabetes No date: Prostatitis No date: Pure hypercholesterolemia, unspecified No date: Renal cysts, acquired, bilateral  Past Surgical History: No date: APPENDECTOMY 06/17/2016: COLONOSCOPY WITH PROPOFOL; N/A     Comment:  Procedure: COLONOSCOPY WITH PROPOFOL;  Surgeon: Scot Jun, MD;  Location[facility] ENDOSCOPY;  Service:               Endoscopy;  Laterality: N/A; No date: LITHOTRIPSY  BMI    Body Mass Index: 39.00 kg/m      Reproductive/Obstetrics negative OB ROS                             Anesthesia Physical Anesthesia Plan  ASA: 3  Anesthesia Plan: General ETT   Post-op Pain Management: Ofirmev IV (intra-op)*   Induction: Intravenous  PONV Risk Score  and Plan: 3 and Ondansetron, Dexamethasone, Midazolam and Treatment may vary due to age or medical condition  Airway Management Planned: Oral ETT  Additional Equipment:   Intra-op Plan:   Post-operative Plan: Extubation in OR  Informed Consent: I have reviewed the patients History and Physical, chart, labs and discussed the procedure including the risks, benefits and alternatives for the proposed anesthesia with the patient or authorized representative who has indicated his/her understanding and acceptance.     Dental Advisory Given  Plan Discussed with: Anesthesiologist, CRNA and Surgeon  Anesthesia Plan Comments: (Patient consented for risks of anesthesia including but not limited to:  - adverse reactions to medications - damage to eyes, teeth, lips or other oral mucosa - nerve damage due to positioning  - sore throat or hoarseness - Damage to heart, brain, nerves, lungs, other parts of body or loss of life  Patient voiced understanding.)       Anesthesia Quick Evaluation

## 2023-07-26 ENCOUNTER — Encounter: Payer: Self-pay | Admitting: Urology

## 2023-08-05 ENCOUNTER — Encounter: Payer: Self-pay | Admitting: Urology

## 2023-08-05 ENCOUNTER — Ambulatory Visit (INDEPENDENT_AMBULATORY_CARE_PROVIDER_SITE_OTHER): Payer: Medicare Other | Admitting: Urology

## 2023-08-05 VITALS — BP 119/75 | HR 66 | Ht 70.0 in | Wt 272.0 lb

## 2023-08-05 DIAGNOSIS — Z466 Encounter for fitting and adjustment of urinary device: Secondary | ICD-10-CM | POA: Diagnosis not present

## 2023-08-05 MED ORDER — LIDOCAINE HCL URETHRAL/MUCOSAL 2 % EX GEL
1.0000 | Freq: Once | CUTANEOUS | Status: AC
Start: 2023-08-05 — End: 2023-08-05
  Administered 2023-08-05: 1 via URETHRAL

## 2023-08-05 MED ORDER — CEPHALEXIN 250 MG PO CAPS
500.0000 mg | ORAL_CAPSULE | Freq: Once | ORAL | Status: AC
Start: 2023-08-05 — End: 2023-08-05
  Administered 2023-08-05: 500 mg via ORAL

## 2023-08-05 NOTE — Progress Notes (Signed)
Cystoscopy Procedure Note:  Indication: Stent removal s/p 07/25/2019 for left ureteroscopy for 6 mm left distal ureteral stone 1.3 cm left renal stone  Keflex given for prophylaxis  After informed consent and discussion of the procedure and its risks, Louis Jackson was positioned and prepped in the standard fashion. Cystoscopy was performed with a flexible cystoscope. The stent was grasped with flexible graspers and removed in its entirety. The patient tolerated the procedure well.  Findings: Uncomplicated stent removal  Assessment and Plan: We discussed general stone prevention strategies including adequate hydration with goal of producing 2.5 L of urine daily, increasing citric acid intake, increasing calcium intake during high oxalate meals, minimizing animal protein, and decreasing salt intake. Information about dietary recommendations given today.   24-hour urine metabolic workup, call with results RTC 6 months KUB prior  Sondra Come, MD 08/05/2023

## 2023-08-05 NOTE — Patient Instructions (Signed)
[facility] Instructions [facility]  You will receive a box/kit in the mail that will have a urine jug and instructions in the kit.  When the box arrives you will need to call our office [phone] to schedule a LAB appointment.  You will need to do a 24hour urine and this should be done during the days that our office will be open.  For example any day from Sunday through Thursday.  If you take Vitamin C 100mg  or greater please stop this 5 days prior to collection.  How to collect the urine sample: On the day you start the urine sample this 1st morning urine should NOT be collected.  For the rest of the day including all night urines should be collected.  On the next morning the 1st urine should be collected and then you will be finished with the urine collections.  You will need to bring the box with you on your LAB appointment day after urine has been collected and all instructions are complete in the box.  Your blood will be drawn and the box will be collected by our Lab employee to be sent off for analysis.  When urine and blood is complete you will need to schedule a follow up appointment for lab results.

## 2023-08-25 ENCOUNTER — Ambulatory Visit: Payer: Medicare Other | Admitting: Dermatology

## 2023-08-25 DIAGNOSIS — Z1283 Encounter for screening for malignant neoplasm of skin: Secondary | ICD-10-CM | POA: Diagnosis not present

## 2023-08-25 DIAGNOSIS — D692 Other nonthrombocytopenic purpura: Secondary | ICD-10-CM

## 2023-08-25 DIAGNOSIS — L821 Other seborrheic keratosis: Secondary | ICD-10-CM

## 2023-08-25 DIAGNOSIS — L918 Other hypertrophic disorders of the skin: Secondary | ICD-10-CM

## 2023-08-25 DIAGNOSIS — L578 Other skin changes due to chronic exposure to nonionizing radiation: Secondary | ICD-10-CM

## 2023-08-25 DIAGNOSIS — D1801 Hemangioma of skin and subcutaneous tissue: Secondary | ICD-10-CM

## 2023-08-25 DIAGNOSIS — W908XXA Exposure to other nonionizing radiation, initial encounter: Secondary | ICD-10-CM | POA: Diagnosis not present

## 2023-08-25 DIAGNOSIS — L814 Other melanin hyperpigmentation: Secondary | ICD-10-CM | POA: Diagnosis not present

## 2023-08-25 DIAGNOSIS — L570 Actinic keratosis: Secondary | ICD-10-CM | POA: Diagnosis not present

## 2023-08-25 DIAGNOSIS — Z7189 Other specified counseling: Secondary | ICD-10-CM

## 2023-08-25 DIAGNOSIS — L219 Seborrheic dermatitis, unspecified: Secondary | ICD-10-CM

## 2023-08-25 DIAGNOSIS — D229 Melanocytic nevi, unspecified: Secondary | ICD-10-CM

## 2023-08-25 DIAGNOSIS — Z5111 Encounter for antineoplastic chemotherapy: Secondary | ICD-10-CM

## 2023-08-25 DIAGNOSIS — Z79899 Other long term (current) drug therapy: Secondary | ICD-10-CM

## 2023-08-25 DIAGNOSIS — Z872 Personal history of diseases of the skin and subcutaneous tissue: Secondary | ICD-10-CM

## 2023-08-25 MED ORDER — FLUOROURACIL 5 % EX CREA: TOPICAL_CREAM | CUTANEOUS | 1 refills | Status: AC

## 2023-08-25 NOTE — Progress Notes (Signed)
Follow-Up Visit   Subjective  Louis Jackson is a 67 y.o. male who presents for the following: Skin Cancer Screening and Upper Body Skin Exam, hx of precancers   The patient presents for Upper Body Skin Exam (UBSE) for skin cancer screening and mole check. The patient has spots, moles and lesions to be evaluated, some may be new or changing and the patient may have concern these could be cancer.    The following portions of the chart were reviewed this encounter and updated as appropriate: medications, allergies, medical history  Review of Systems:  No other skin or systemic complaints except as noted in HPI or Assessment and Plan.  Objective  Well appearing patient in no apparent distress; mood and affect are within normal limits.  All skin waist up examined. Relevant physical exam findings are noted in the Assessment and Plan.  Scalp x 3 (3) Erythematous thin papules/macules with gritty scale.     Assessment & Plan   AK (actinic keratosis) (3) Scalp x 3  Actinic keratoses are precancerous spots that appear secondary to cumulative UV radiation exposure/sun exposure over time. They are chronic with expected duration over 1 year. A portion of actinic keratoses will progress to squamous cell carcinoma of the skin. It is not possible to reliably predict which spots will progress to skin cancer and so treatment is recommended to prevent development of skin cancer.  Recommend daily broad spectrum sunscreen SPF 30+ to sun-exposed areas, reapply every 2 hours as needed.  Recommend staying in the shade or wearing long sleeves, sun glasses (UVA+UVB protection) and wide brim hats (4-inch brim around the entire circumference of the hat). Call for new or changing lesions.   Destruction of lesion - Scalp x 3 (3) Complexity: simple   Destruction method: cryotherapy   Informed consent: discussed and consent obtained   Timeout:  patient name, date of birth, surgical site, and procedure  verified Lesion destroyed using liquid nitrogen: Yes   Region frozen until ice ball extended beyond lesion: Yes   Outcome: patient tolerated procedure well with no complications   Post-procedure details: wound care instructions given     Skin cancer screening performed today.  ACTINIC DAMAGE WITH PRECANCEROUS ACTINIC KERATOSES Counseling for Topical Chemotherapy Management: Patient exhibits: - Severe, confluent actinic changes with pre-cancerous actinic keratoses that is secondary to cumulative UV radiation exposure over time - Condition that is severe; chronic, not at goal. - diffuse scaly erythematous macules and papules with underlying dyspigmentation - Discussed Prescription "Field Treatment" topical Chemotherapy for Severe, Chronic Confluent Actinic Changes with Pre-Cancerous Actinic Keratoses Field treatment involves treatment of an entire area of skin that has confluent Actinic Changes (Sun/ Ultraviolet light damage) and PreCancerous Actinic Keratoses by method of PhotoDynamic Therapy (PDT) and/or prescription Topical Chemotherapy agents such as 5-fluorouracil, 5-fluorouracil/calcipotriene, and/or imiquimod.  The purpose is to decrease the number of clinically evident and subclinical PreCancerous lesions to prevent progression to development of skin cancer by chemically destroying early precancer changes that may or may not be visible.  It has been shown to reduce the risk of developing skin cancer in the treated area. As a result of treatment, redness, scaling, crusting, and open sores may occur during treatment course. One or more than one of these methods may be used and may have to be used several times to control, suppress and eliminate the PreCancerous changes. Discussed treatment course, expected reaction, and possible side effects. - Recommend daily broad spectrum sunscreen SPF 30+ to sun-exposed  areas, reapply every 2 hours as needed.  - Staying in the shade or wearing long sleeves,  sun glasses (UVA+UVB protection) and wide brim hats (4-inch brim around the entire circumference of the hat) are also recommended. - Call for new or changing lesions.  Start 5FU/Calcipotriene cream apply to temple and scalp twice a day for 7 days  Lentigines, Seborrheic Keratoses, Hemangiomas - Benign normal skin lesions - Benign-appearing - Call for any changes  Melanocytic Nevi - Tan-brown and/or pink-flesh-colored symmetric macules and papules - Benign appearing on exam today - Observation - Call clinic for new or changing moles - Recommend daily use of broad spectrum spf 30+ sunscreen to sun-exposed areas.   Purpura - Chronic; persistent and recurrent.  Treatable, but not curable. - Violaceous macules and patches - Benign - Related to trauma, age, sun damage and/or use of blood thinners, chronic use of topical and/or oral steroids - Observe - Can use OTC arnica containing moisturizer such as Dermend Bruise Formula if desired - Call for worsening or other concerns   Acrochordons (Skin Tags) - Fleshy, skin-colored pedunculated papules - Benign appearing.  - Observe. - If desired, they can be removed with an in office procedure that is not covered by insurance. - Please call the clinic if you notice any new or changing lesions.   Seborrheic dermatitis Scalp, face Chronic and persistent condition with duration or expected duration over one year. Condition is bothersome/symptomatic for patient. Currently flared. Seborrheic Dermatitis  -  is a chronic persistent rash characterized by pinkness and scaling most commonly of the mid face but also can occur on the scalp (dandruff), ears; mid chest, mid back and groin.  It tends to be exacerbated by stress and cooler weather.  People who have neurologic disease may experience new onset or exacerbation of existing seborrheic dermatitis.  The condition is not curable but treatable and can be controlled.   Continue Ketoconazole 2% cream M,  W, F at bedtime to face as needed. Continue  Hydrocortisone 2.5% cream  Tue., Thur., Sat. at bedtime as needed only.   Topical steroids (such as triamcinolone, fluocinolone, fluocinonide, mometasone, clobetasol, halobetasol, betamethasone, hydrocortisone) can cause thinning and lightening of the skin if they are used for too long in the same area. Your physician has selected the right strength medicine for your problem and area affected on the body. Please use your medication only as directed by your physician to prevent side effects.      Return in about 6 months (around 02/23/2024) for hx of AKs .  IAngelique Holm, CMA, am acting as scribe for Armida Sans, MD .   Documentation: I have reviewed the above documentation for accuracy and completeness, and I agree with the above.  Armida Sans, MD

## 2023-08-25 NOTE — Patient Instructions (Addendum)
Instructions for [facility] Medications  One or more of your medications was sent to the [facility] mail order compounding pharmacy. You will receive an email from them and can purchase the medicine through that link. It will then be mailed to your home at the address you confirmed. If for any reason you do not receive an email from them, please check your spam folder. If you still do not find the email, please let us know. [facility] phone number is [phone].    5-Fluorouracil/Calcipotriene Patient Education  Start 5FU/Calcipotriene cream apply to temple and scalp twice a day for 7 days  Actinic keratoses are the dry, red scaly spots on the skin caused by sun damage. A portion of these spots can turn into skin cancer with time, and treating them can help prevent development of skin cancer.   Treatment of these spots requires removal of the defective skin cells. There are various ways to remove actinic keratoses, including freezing with liquid nitrogen, treatment with creams, or treatment with a blue light procedure in the office.   5-fluorouracil cream is a topical cream used to treat actinic keratoses. It works by interfering with the growth of abnormal fast-growing skin cells, such as actinic keratoses. These cells peel off and are replaced by healthy ones.   5-fluorouracil/calcipotriene is a combination of the 5-fluorouracil cream with a vitamin D analog cream called calcipotriene. The calcipotriene alone does not treat actinic keratoses. However, when it is combined with 5-fluorouracil, it helps the 5-fluorouracil treat the actinic keratoses much faster so that the same results can be achieved with a much shorter treatment time.  INSTRUCTIONS FOR 5-FLUOROURACIL/CALCIPOTRIENE CREAM:   5-fluorouracil/calcipotriene cream typically only needs to be used for 4-7 days. A thin layer should be applied twice a day to the treatment areas recommended by your physician.   If your  physician prescribed you separate tubes of 5-fluourouracil and calcipotriene, apply a thin layer of 5-fluorouracil followed by a thin layer of calcipotriene.   Avoid contact with your eyes, nostrils, and mouth. Do not use 5-fluorouracil/calcipotriene cream on infected or open wounds.   You will develop redness, irritation and some crusting at areas where you have pre-cancer damage/actinic keratoses. IF YOU DEVELOP PAIN, BLEEDING, OR SIGNIFICANT CRUSTING, STOP THE TREATMENT EARLY - you have already gotten a good response and the actinic keratoses should clear up well.  Wash your hands after applying 5-fluorouracil 5% cream on your skin.   A moisturizer or sunscreen with a minimum SPF 30 should be applied each morning.   Once you have finished the treatment, you can apply a thin layer of Vaseline twice a day to irritated areas to soothe and calm the areas more quickly. If you experience significant discomfort, contact your physician.  For some patients it is necessary to repeat the treatment for best results.  SIDE EFFECTS: When using 5-fluorouracil/calcipotriene cream, you may have mild irritation, such as redness, dryness, swelling, or a mild burning sensation. This usually resolves within 2 weeks. The more actinic keratoses you have, the more redness and inflammation you can expect during treatment. Eye irritation has been reported rarely. If this occurs, please let us know.  If you have any trouble using this cream, please call the office. If you have any other questions about this information, please do not hesitate to ask me before you leave the office.       Cryotherapy Aftercare  Wash gently with soap and water everyday.   Apply Vaseline and Band-Aid  daily until healed.      Due to recent changes in healthcare laws, you may see results of your pathology and/or laboratory studies on MyChart before the doctors have had a chance to review them. We understand that in some cases there  may be results that are confusing or concerning to you. Please understand that not all results are received at the same time and often the doctors may need to interpret multiple results in order to provide you with the best plan of care or course of treatment. Therefore, we ask that you please give [place] 2 business days to thoroughly review all your results before contacting the office for clarification. Should we see a critical lab result, you will be contacted sooner.   If You Need Anything After Your Visit  If you have any questions or concerns for your doctor, please call our main line at [phone] and press option 4 to reach your doctor's medical assistant. If no one answers, please leave a voicemail as directed and we will return your call as soon as possible. Messages left after 4 pm will be answered the following business day.   You may also send [place] a message via MyChart. We typically respond to MyChart messages within 1-2 business days.  For prescription refills, please ask your pharmacy to contact our office. Our fax number is [phone].  If you have an urgent issue when the clinic is closed that cannot wait until the next business day, you can page your doctor at the number below.    Please note that while we do our best to be available for urgent issues outside of office hours, we are not available 24/7.   If you have an urgent issue and are unable to reach [place], you may choose to seek medical care at your doctor's office, retail clinic, urgent care center, or emergency room.  If you have a medical emergency, please immediately call 911 or go to the emergency department.  Pager Numbers  - Dr. Gwen Pounds: [phone]  - Dr. Roseanne Reno: [phone]  - Dr. Katrinka Blazing: [phone]   In the event of inclement weather, please call our main line at [phone] for an update on the status of any delays or closures.  Dermatology Medication Tips: Please keep the boxes that topical  medications come in in order to help keep track of the instructions about where and how to use these. Pharmacies typically print the medication instructions only on the boxes and not directly on the medication tubes.   If your medication is too expensive, please contact our office at [phone] option 4 or send [place] a message through MyChart.   We are unable to tell what your co-pay for medications will be in advance as this is different depending on your insurance coverage. However, we may be able to find a substitute medication at lower cost or fill out paperwork to get insurance to cover a needed medication.   If a prior authorization is required to get your medication covered by your insurance company, please allow [place] 1-2 business days to complete this process.  Drug prices often vary depending on where the prescription is filled and some pharmacies may offer cheaper prices.  The website www.goodrx.com contains coupons for medications through different pharmacies. The prices here do not account for what the cost may be with help from insurance (it may be cheaper with your insurance), but the website can give you the price if you did not use any insurance.  -  You can print the associated coupon and take it with your prescription to the pharmacy.  - You may also stop by our office during regular business hours and pick up a GoodRx coupon card.  - If you need your prescription sent electronically to a different pharmacy, notify our office through [facility] or by phone at [phone] option 4.     Si Usted Necesita Algo Despus de Su Visita  Tambin puede enviarnos un mensaje a travs de Clinical cytogeneticist. Por lo general respondemos a los mensajes de MyChart en el transcurso de 1 a 2 das hbiles.  Para renovar recetas, por favor pida a su farmacia que se ponga en contacto con nuestra oficina. Annie Sable de fax es [place] [phone].  Si tiene un asunto urgente cuando la clnica est  cerrada y que no puede esperar hasta el siguiente da hbil, puede llamar/localizar a su doctor(a) al nmero que aparece a continuacin.   Por favor, tenga en cuenta que aunque hacemos todo lo posible para estar disponibles para asuntos urgentes fuera del horario de [place], no estamos disponibles las 24 horas del da, los 7 [address]  [address] de la [place].   Si tiene un problema urgente y no puede comunicarse con nosotros, puede optar por buscar atencin mdica  en el consultorio de su doctor(a), en una clnica privada, en un centro de atencin urgente o en una sala de emergencias.  Si tiene Engineer, drilling, por favor llame inmediatamente al 911 o vaya a la sala de emergencias.  Nmeros de bper  - Dr. Gwen Pounds: [phone]  - Dra. Roseanne Reno: [phone]  - Dr. Katrinka Blazing: [phone]   En caso de inclemencias del tiempo, por favor llame a Lacy Duverney principal al [phone] para una actualizacin sobre el [place] de cualquier retraso o cierre.  Consejos para la medicacin en dermatologa: Por favor, guarde las cajas en las que vienen los medicamentos de uso tpico para ayudarle a seguir las instrucciones sobre dnde y cmo usarlos. Las farmacias generalmente imprimen las instrucciones del medicamento slo en las cajas y no directamente en los tubos del [place].   Si su medicamento es muy caro, por favor, pngase en contacto con Rolm Gala llamando al [phone] y presione la opcin 4 o envenos un mensaje a travs de Clinical cytogeneticist.   No podemos decirle cul ser su copago por los medicamentos por adelantado ya que esto es diferente dependiendo de la cobertura de su seguro. Sin embargo, es posible que podamos encontrar un medicamento sustituto a Audiological scientist un formulario para que el seguro cubra el medicamento que se considera necesario.   Si se requiere una autorizacin previa para que su compaa de seguros [place] su medicamento, por favor permtanos de 1 a 2 das hbiles para  completar [address]  Los precios de los medicamentos varan con frecuencia dependiendo del Environmental consultant de dnde se surte la receta y alguna farmacias pueden ofrecer precios ms baratos.  El sitio web www.goodrx.com tiene cupones para medicamentos de Health and safety inspector. Los precios aqu no tienen en cuenta lo que podra costar con la ayuda del seguro (puede ser ms barato con su seguro), pero el sitio web puede darle el precio si no utiliz Tourist information centre manager.  - Puede imprimir el cupn correspondiente y llevarlo con su receta a la farmacia.  - Tambin puede pasar por nuestra oficina durante el horario de atencin regular y Education officer, museum una tarjeta de cupones de GoodRx.  - Si necesita que su receta se enve electrnicamente a [employer],  informe a nuestra oficina a travs de MyChart de [place] o por telfono llamando al [phone] y presione la opcin 4.

## 2023-08-28 ENCOUNTER — Ambulatory Visit: Payer: Medicare Other | Admitting: Dermatology

## 2023-09-02 ENCOUNTER — Encounter: Payer: Self-pay | Admitting: Dermatology

## 2023-12-16 ENCOUNTER — Ambulatory Visit (INDEPENDENT_AMBULATORY_CARE_PROVIDER_SITE_OTHER): Payer: Medicare Other | Admitting: Podiatry

## 2023-12-16 ENCOUNTER — Ambulatory Visit (INDEPENDENT_AMBULATORY_CARE_PROVIDER_SITE_OTHER): Payer: Medicare Other

## 2023-12-16 DIAGNOSIS — M2011 Hallux valgus (acquired), right foot: Secondary | ICD-10-CM

## 2023-12-16 DIAGNOSIS — M722 Plantar fascial fibromatosis: Secondary | ICD-10-CM | POA: Diagnosis not present

## 2023-12-16 DIAGNOSIS — M2012 Hallux valgus (acquired), left foot: Secondary | ICD-10-CM

## 2023-12-16 MED ORDER — BETAMETHASONE SOD PHOS & ACET 6 (3-3) MG/ML IJ SUSP
3.0000 mg | Freq: Once | INTRAMUSCULAR | Status: AC
Start: 2023-12-16 — End: 2023-12-16
  Administered 2023-12-16: 3 mg via INTRA_ARTICULAR

## 2023-12-16 MED ORDER — METHYLPREDNISOLONE 4 MG PO TBPK: ORAL_TABLET | ORAL | 0 refills | Status: DC

## 2023-12-16 NOTE — Progress Notes (Signed)
   Chief Complaint  Patient presents with   Foot Pain    I think it's time for injections.  My heels are hurting again.    Subjective: 68 y.o. male presenting today for follow-up evaluation of plantar fasciitis and chronic heel pain bilateral.  Injections in the past have helped significantly.  He also has bunion deformity and he has been managing conservatively.  Past Medical History:  Diagnosis Date   Bradycardia    Chronic kidney disease    GERD (gastroesophageal reflux disease)    h/o   Gout    History of kidney stones    Hypertension    Lumbar stenosis    Nephrolithiasis    Obesity    OSA on CPAP    Pre-diabetes    Prostatitis    Pure hypercholesterolemia, unspecified    Renal cysts, acquired, bilateral    Past Surgical History:  Procedure Laterality Date   APPENDECTOMY     COLONOSCOPY WITH PROPOFOL  N/A 06/17/2016   Procedure: COLONOSCOPY WITH PROPOFOL ;  Surgeon: Lamar ONEIDA Holmes, MD;  Location: [facility] ENDOSCOPY;  Service: Endoscopy;  Laterality: N/A;   CYSTOSCOPY/URETEROSCOPY/HOLMIUM LASER/STENT PLACEMENT Left 07/25/2023   Procedure: CYSTOSCOPY/URETEROSCOPY/HOLMIUM LASER/STENT PLACEMENT;  Surgeon: Francisca Redell BROCKS, MD;  Location: [facility] ORS;  Service: Urology;  Laterality: Left;   LITHOTRIPSY     No Known Allergies   Objective: Physical Exam General: The patient is alert and oriented x3 in no acute distress.  Dermatology: Skin is warm, dry and supple bilateral lower extremities. Negative for open lesions or macerations bilateral.   Vascular: Dorsalis Pedis and Posterior Tibial pulses palpable bilateral.  Capillary fill time is immediate to all digits.  Neurological: Grossly intact via light touch  Musculoskeletal: Tenderness to palpation along the plantar medial aspect of the plantar fascia bilateral.  Hallux valgus also noted bilateral right greater than the left  Radiographic exam B/L feet 12/16/2023: Normal osseous mineralization.  Degenerative changes noted  of the first MTP bilateral right greater than the left.  Hallux valgus deformity also noted.  Assessment: 1. plantar fasciitis bilateral feet 2.  Hallux valgus bilateral 3.  H/o Insertional Achilles tendinitis right; currently asymptomatic  Plan of Care:  - Patient evaluated.  X-rays reviewed -Patient cannot tolerate oral NSAIDs.  Patient has history of chronic kidney stones -Continue Hoka shoes with OTC arch supports from [employer] running [employer] -Injection of 0.5 cc Celestone  Soluspan injected to the plantar medial aspect of the plantar fascia bilateral -Patient has had orthotics in the past and did not find them comfortable.  Continue wear good supportive tennis shoes and sneakers. -The hallux valgus deformity to the bilateral feet continues to be minimally symptomatic.  Continue conservative treatment and care -Prescription for Medrol  Dosepak -Return to clinic as needed  *Retired.  Avid golfer 3-4x/week.   Thresa EMERSON Sar, DPM [facility]  Dr. Thresa EMERSON Sar, DPM    [address] [place], [place] [address]                Office [phone]  Fax [phone]

## 2024-02-03 ENCOUNTER — Encounter: Payer: Self-pay | Admitting: Urology

## 2024-02-03 ENCOUNTER — Ambulatory Visit (INDEPENDENT_AMBULATORY_CARE_PROVIDER_SITE_OTHER): Payer: Medicare Other | Admitting: Urology

## 2024-02-03 ENCOUNTER — Ambulatory Visit
Admission: RE | Admit: 2024-02-03 | Discharge: 2024-02-03 | Disposition: A | Discharge status: Home / Self Care | Source: Ambulatory Visit | Attending: Urology

## 2024-02-03 ENCOUNTER — Ambulatory Visit
Admission: RE | Admit: 2024-02-03 | Discharge: 2024-02-03 | Disposition: A | Discharge status: Home / Self Care | Attending: Urology | Admitting: Urology

## 2024-02-03 VITALS — BP 134/84 | Ht 70.0 in | Wt 260.0 lb

## 2024-02-03 DIAGNOSIS — Z125 Encounter for screening for malignant neoplasm of prostate: Secondary | ICD-10-CM | POA: Diagnosis not present

## 2024-02-03 DIAGNOSIS — N200 Calculus of kidney: Secondary | ICD-10-CM | POA: Diagnosis not present

## 2024-02-03 DIAGNOSIS — Z466 Encounter for fitting and adjustment of urinary device: Secondary | ICD-10-CM

## 2024-02-03 DIAGNOSIS — N529 Male erectile dysfunction, unspecified: Secondary | ICD-10-CM

## 2024-02-03 NOTE — Patient Instructions (Addendum)

## 2024-02-03 NOTE — Progress Notes (Signed)
   02/03/2024 9:17 AM   Louis Jackson Feb 21, 1956 [id number]  Reason for visit: Follow up nephrolithiasis, ED, PSA screening  HPI: 68 year old male with extensive prior history of nephrolithiasis, he reports over 50 prior stone events primarily on the left side.  He underwent left-sided ureteroscopy for a large distal ureteral stone as well as a 1.3 cm left renal pelvis stone in September 2024.  He has not had any problems or stone events since that time.    I personally viewed and interpreted the KUB today that shows a stable 5 mm right lower pole stone, no left-sided stones. We discussed general stone prevention strategies including adequate hydration with goal of producing 2.5 L of urine daily, increasing citric acid intake, increasing calcium intake during high oxalate meals, minimizing animal protein, and decreasing salt intake. Information about dietary recommendations given today.   He also has ED that has been refractory to sildenafil and Cialis, is not interested in other treatments like penile injections at this time.  PSA has been normal, most recently 0.8, reassurance provided.  24-hour urine metabolic workup, call with results Continue yearly KUB  Sondra Come, MD  [facility] [address] [place], [place] [address] [phone]

## 2024-02-15 ENCOUNTER — Ambulatory Visit
Admission: EM | Admit: 2024-02-15 | Discharge: 2024-02-15 | Disposition: A | Discharge status: Home / Self Care | Attending: Emergency Medicine | Admitting: Emergency Medicine

## 2024-02-15 ENCOUNTER — Ambulatory Visit (INDEPENDENT_AMBULATORY_CARE_PROVIDER_SITE_OTHER)

## 2024-02-15 DIAGNOSIS — M25532 Pain in left wrist: Secondary | ICD-10-CM

## 2024-02-15 DIAGNOSIS — S52572A Other intraarticular fracture of lower end of left radius, initial encounter for closed fracture: Secondary | ICD-10-CM

## 2024-02-15 DIAGNOSIS — S52502A Unspecified fracture of the lower end of left radius, initial encounter for closed fracture: Secondary | ICD-10-CM

## 2024-02-15 NOTE — ED Notes (Addendum)
 Unable to place patient in sugar tongue splint d/t lack of supplies in appropriate size. Provider Wendee Beavers NP aware.   Patient placed in XL sling.

## 2024-02-15 NOTE — ED Provider Notes (Signed)
 Louis Jackson    CSN: [id number] Arrival date & time: 02/15/24  1403      History   Chief Complaint Chief Complaint  Patient presents with   Fall    HPI Louis Jackson is a 68 y.o. male.  Patient presents with left wrist pain after he accidentally fell this afternoon.  He fell backwards off a stool while sitting and caught himself with his left wrist.  No head injury or loss of consciousness.  No numbness, weakness, paresthesias, open wounds.  No OTC medication taken today.  The history is provided by the patient, the spouse and medical records.    Past Medical History:  Diagnosis Date   Bradycardia    Chronic kidney disease    GERD (gastroesophageal reflux disease)    h/o   Gout    History of kidney stones    Hypertension    Lumbar stenosis    Nephrolithiasis    Obesity    OSA on CPAP    Pre-diabetes    Prostatitis    Pure hypercholesterolemia, unspecified    Renal cysts, acquired, bilateral     Patient Active Problem List   Diagnosis Date Noted   Acute left-sided low back pain with left-sided sciatica 06/21/2020   Lumbar stenosis without neurogenic claudication 06/21/2020   Renal cysts, acquired, bilateral 06/15/2020   Nephrolithiasis 06/15/2020   OSA on CPAP 04/29/2016   Benign essential hypertension 08/31/2015   Mild obesity 08/31/2015   Pure hypercholesterolemia 08/31/2015    Past Surgical History:  Procedure Laterality Date   APPENDECTOMY     COLONOSCOPY WITH PROPOFOL N/A 06/17/2016   Procedure: COLONOSCOPY WITH PROPOFOL;  Surgeon: Scot Jun, MD;  [facility] END[facility]COPY;  Service: Endoscopy;  Laterality: N/A;   CYSTOSCOPY/URETEROSCOPY/HOLMIUM LASER/STENT PLACEMENT Left 07/25/2023   Procedure: CYSTOSCOPY/URETEROSCOPY/HOLMIUM LASER/STENT PLACEMENT;  Surgeon: Sondra Come, MD;  Location: [facility] ORS;  Service: Urology;  Laterality: Left;   LITHOTRIPSY         Home Medications    Prior to Admission medications   Medication Sig  Start Date End Date Taking? Authorizing Provider  aspirin 81 MG tablet Take 81 mg by mouth daily.    [provider]  colchicine 0.6 MG tablet Take 0.6 mg by mouth every evening. 04/18/20   [provider]  diphenhydrAMINE (BENADRYL) 25 mg capsule Take 50 mg by mouth as needed.    [provider]  fluorouracil (EFUDEX) 5 % cream Apply to temples and scalp twice a day for 7 days then stop 08/25/23   Deirdre Evener, MD  gabapentin (NEURONTIN) 300 MG capsule Take 300 mg by mouth 3 (three) times daily. 07/12/20   [provider]  hydrocortisone 2.5 % cream Apply Tue., Thur., Sat. at bedtime as needed only. Patient taking differently: 1 Application 3 (three) times a week. Apply Tue., Thur., Sat. at bedtime as needed only. 08/12/22   Deirdre Evener, MD  ketoconazole (NIZORAL) 2 % cream Apply M, W, F at bedtime to face as needed. Patient taking differently: 1 Application 3 (three) times a week. Apply M, W, F at bedtime to face as needed. 08/12/22   Deirdre Evener, MD  naproxen sodium (ALEVE) 220 MG tablet Take 220 mg by mouth daily as needed.    [provider]  olmesartan-hydrochlorothiazide (BENICAR HCT) 20-12.5 MG tablet Take 1 tablet by mouth at bedtime.    [provider]  oxyCODONE-acetaminophen (PERCOCET) 5-325 MG tablet Take 1 tablet by mouth every 6 (  six) hours as needed for severe pain. 07/25/23 07/24/24  Sondra Come, MD  pravastatin (PRAVACHOL) 20 MG tablet Take 20 mg by mouth at bedtime. 08/09/19   [provider]  tadalafil (CIALIS) 20 MG tablet Take 1 tablet (20 mg total) by mouth daily as needed for erectile dysfunction (take 45 minutes prior to sexual activity). 06/17/23   Sondra Come, MD  tamsulosin (FLOMAX) 0.4 MG CAPS capsule Take 1 capsule (0.4 mg total) by mouth daily after supper. 07/25/23   Sondra Come, MD    Family History History reviewed. No pertinent family history.  Social History Social History    Tobacco Use   Smoking status: Former    Types: Cigars   Smokeless tobacco: Never  Vaping Use   Vaping status: Never Used  Substance Use Topics   Alcohol use: Yes    Alcohol/week: 14.0 standard drinks of alcohol    Types: 14 Cans of beer per week    Comment: occ   Drug use: No     Allergies   Patient has no known allergies.   Review of Systems Review of Systems  Musculoskeletal:  Positive for arthralgias and joint swelling.  Skin:  Negative for color change, rash and wound.  Neurological:  Negative for weakness and numbness.     Physical Exam Triage Vital Signs ED Triage Vitals  Encounter Vitals Group     BP      Systolic BP Percentile      Diastolic BP Percentile      Pulse      Resp      Temp      Temp src      SpO2      Weight      Height      Head Circumference      Peak Flow      Pain Score      Pain Loc      Pain Education      Exclude from Growth Chart    No data found.  Updated Vital Signs BP 121/83   Pulse (!) 53   Temp (!) 97.5 F (36.4 C)   Resp 18   SpO2 97%   Visual Acuity Right Eye Distance:   Left Eye Distance:   Bilateral Distance:    Right Eye Near:   Left Eye Near:    Bilateral Near:     Physical Exam Constitutional:      General: He is not in acute distress. HENT:     Mouth/Throat:     Mouth: Mucous membranes are moist.  Cardiovascular:     Rate and Rhythm: Normal rate and regular rhythm.  Pulmonary:     Effort: Pulmonary effort is normal. No respiratory distress.  Musculoskeletal:        General: Swelling and tenderness present. No deformity.  Skin:    General: Skin is warm and dry.     Capillary Refill: Capillary refill takes less than 2 seconds.     Findings: No bruising, erythema, lesion or rash.  Neurological:     General: No focal deficit present.     Mental Status: He is alert and oriented to person, place, and time.     Sensory: No sensory deficit.     Motor: No weakness.      UC Treatments /  Results  Labs (all labs ordered are listed, but only abnormal results are displayed) Labs Reviewed - No data to display  EKG  Radiology DG Wrist Complete Left Result Date: 02/15/2024 CLINICAL DATA:  Larey Seat and injured left wrist today. EXAM: LEFT WRIST - COMPLETE 3+ VIEW COMPARISON:  None Available. FINDINGS: Relatively nondisplaced longitudinal intra-articular fracture involving the radial aspect of the radius suggesting a Chauffeur's type fracture. No associated ulnar styloid fracture. The carpal and metacarpal bones are intact. Severe degenerative changes at the [facility] joint of the thumb. IMPRESSION: Relatively nondisplaced intra-articular fracture involving the radial aspect of the radius (Chauffeur's fracture). Severe degenerative changes at the [facility] joint of the thumb. Electronically Signed   By: Rudie Meyer M.D.   On: 02/15/2024 14:45    Procedures Procedures (including critical care time)  Medications Ordered in UC Medications - No data to display  Initial Impression / Assessment and Plan / UC Course  I have reviewed the triage vital signs and the nursing notes.  Pertinent labs & imaging results that were available during my care of the patient were reviewed by me and considered in my medical decision making (see chart for details).    Left wrist pain, Cosed intra-articular fracture of distal left radius.  Unable to apply splint here as the splinting material available is not large enough for the patient's arm.  Sling applied.  Sending patient to [facility] walk-in clinic.  Patient is agreeable to this and has been a patient at [facility] previously.  This is his preferred orthopedic.  Education provided on radial fracture.  Discussed rest, elevation, ice, ibuprofen.  Neurovascular status intact.  Attempted to reach [facility] via telephone but no answer other than receptionist who answers for all of the [facility] clinics and she was unable to get a specific telephone pick up from  the [place] office.  The receptionist states the patient can go to the [place] office and be seen as a walk-in.  Final Clinical Impressions(s) / UC Diagnoses   Final diagnoses:  Left wrist pain  Other closed intra-articular fracture of distal end of left radius, initial encounter     Discharge Instructions      Rest and elevate your wrist.  Apply ice packs as directed.  Take ibuprofen as directed.  Wear the sling as directed.  Go to [facility] walk-in clinic as directed..     ED Prescriptions   None    PDMP not reviewed this encounter.   Mickie Bail, NP 02/15/24 1505

## 2024-02-15 NOTE — Discharge Instructions (Addendum)
 Rest and elevate your wrist.  Apply ice packs as directed.  Take ibuprofen as directed.  Wear the sling as directed.  Go to [facility] walk-in clinic as directed.Marland Kitchen

## 2024-02-15 NOTE — ED Triage Notes (Signed)
 Patient to Urgent Care with complaints of left sided wrist pain following a fall this afternoon.   Reports he attempted to sit back on a stool and fell backwards. Braced himself with his left wrist. Pain worse w/ turning wrist.   Denies hitting head/ LOC. Daily aspirin. Denies hx of prior injury.

## 2024-03-02 ENCOUNTER — Ambulatory Visit (INDEPENDENT_AMBULATORY_CARE_PROVIDER_SITE_OTHER): Admitting: Podiatry

## 2024-03-02 ENCOUNTER — Encounter: Payer: Self-pay | Admitting: Podiatry

## 2024-03-02 DIAGNOSIS — M722 Plantar fascial fibromatosis: Secondary | ICD-10-CM

## 2024-03-02 NOTE — Progress Notes (Signed)
   No chief complaint on file.   Subjective: 68 y.o. male presenting today for follow-up evaluation of plantar fasciitis and chronic heel pain bilateral.  Unfortunately patient continues to have pain and tenderness to the plantar heel.  This is very debilitating and ongoing for several years despite conservative treatment.  Presenting for further treatment evaluation  Past Medical History:  Diagnosis Date   Bradycardia    Chronic kidney disease    GERD (gastroesophageal reflux disease)    h/o   Gout    History of kidney stones    Hypertension    Lumbar stenosis    Nephrolithiasis    Obesity    OSA on CPAP    Pre-diabetes    Prostatitis    Pure hypercholesterolemia, unspecified    Renal cysts, acquired, bilateral    Past Surgical History:  Procedure Laterality Date   APPENDECTOMY     COLONOSCOPY WITH PROPOFOL  N/A 06/17/2016   Procedure: COLONOSCOPY WITH PROPOFOL ;  Surgeon: Cassie Click, MD;  Location: [facility] ENDOSCOPY;  Service: Endoscopy;  Laterality: N/A;   CYSTOSCOPY/URETEROSCOPY/HOLMIUM LASER/STENT PLACEMENT Left 07/25/2023   Procedure: CYSTOSCOPY/URETEROSCOPY/HOLMIUM LASER/STENT PLACEMENT;  Surgeon: Lawerence Pressman, MD;  Location: [facility] ORS;  Service: Urology;  Laterality: Left;   LITHOTRIPSY     No Known Allergies   Objective: Physical Exam General: The patient is alert and oriented x3 in no acute distress.  Dermatology: Skin is warm, dry and supple bilateral lower extremities. Negative for open lesions or macerations bilateral.   Vascular: Dorsalis Pedis and Posterior Tibial pulses palpable bilateral.  Capillary fill time is immediate to all digits.  Neurological: Grossly intact via light touch  Musculoskeletal: There continues to be chronic severe tenderness to palpation along the plantar medial aspect of the plantar fascia bilateral.  Hallux valgus also noted bilateral right greater than the left  Radiographic exam B/L feet 12/16/2023: Normal osseous  mineralization.  Degenerative changes noted of the first MTP bilateral right greater than the left.  Hallux valgus deformity also noted.  Assessment: 1. plantar fasciitis bilateral feet 2.  Hallux valgus bilateral 3.  H/o Insertional Achilles tendinitis right; currently asymptomatic  Plan of Care:  - Patient evaluated.  X-rays reviewed -Patient cannot tolerate oral NSAIDs.  Patient has history of chronic kidney stones - Unfortunately despite conservative treatment the patient continues to have pain and tenderness to the plantar heel.  He has pursued conservative management for over 2 years now with no lasting alleviation of his symptoms.  Today surgery was discussed in detail.  Risk benefits advantages and disadvantages the procedure as well as the postoperative recovery course were explained.  No guarantees were expressed or implied -Authorization for surgery was initiated today.  Surgery will consist of endoscopic plantar fasciotomy left -Return to clinic 1 week postop  *Retired.  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 Dot Gazella, DPM    [address] [place], [place] [address]                Office [phone]  Fax [phone]

## 2024-03-08 ENCOUNTER — Other Ambulatory Visit: Payer: Self-pay

## 2024-03-08 DIAGNOSIS — N200 Calculus of kidney: Secondary | ICD-10-CM

## 2024-03-15 ENCOUNTER — Telehealth: Payer: Self-pay | Admitting: Podiatry

## 2024-03-15 NOTE — Telephone Encounter (Signed)
 Pt called and is scheduled for surgery on lt foot on 03/25/24 and is wondering how long will he be in the boot? You were planning on doing the right foot as well and he is going on vacation on 6/26 and would like to be able to drive. What do you recommend him going ahead and doing left first and right after vacation or proceeding with right foot first.

## 2024-03-16 NOTE — Telephone Encounter (Signed)
 Notified pt and he said thank you and we will keep surgery as planned 5/15 left.

## 2024-03-18 ENCOUNTER — Ambulatory Visit: Payer: Medicare Other | Admitting: Dermatology

## 2024-03-18 ENCOUNTER — Encounter: Payer: Self-pay | Admitting: Dermatology

## 2024-03-18 DIAGNOSIS — Z872 Personal history of diseases of the skin and subcutaneous tissue: Secondary | ICD-10-CM

## 2024-03-18 DIAGNOSIS — Z1283 Encounter for screening for malignant neoplasm of skin: Secondary | ICD-10-CM | POA: Diagnosis not present

## 2024-03-18 DIAGNOSIS — D692 Other nonthrombocytopenic purpura: Secondary | ICD-10-CM

## 2024-03-18 DIAGNOSIS — L570 Actinic keratosis: Secondary | ICD-10-CM | POA: Diagnosis not present

## 2024-03-18 DIAGNOSIS — L219 Seborrheic dermatitis, unspecified: Secondary | ICD-10-CM

## 2024-03-18 DIAGNOSIS — L578 Other skin changes due to chronic exposure to nonionizing radiation: Secondary | ICD-10-CM

## 2024-03-18 DIAGNOSIS — W908XXA Exposure to other nonionizing radiation, initial encounter: Secondary | ICD-10-CM | POA: Diagnosis not present

## 2024-03-18 DIAGNOSIS — D1801 Hemangioma of skin and subcutaneous tissue: Secondary | ICD-10-CM

## 2024-03-18 DIAGNOSIS — L821 Other seborrheic keratosis: Secondary | ICD-10-CM

## 2024-03-18 DIAGNOSIS — Z79899 Other long term (current) drug therapy: Secondary | ICD-10-CM

## 2024-03-18 DIAGNOSIS — L814 Other melanin hyperpigmentation: Secondary | ICD-10-CM | POA: Diagnosis not present

## 2024-03-18 DIAGNOSIS — Z7189 Other specified counseling: Secondary | ICD-10-CM

## 2024-03-18 DIAGNOSIS — D229 Melanocytic nevi, unspecified: Secondary | ICD-10-CM

## 2024-03-18 NOTE — Progress Notes (Signed)
 Follow-Up Visit   Subjective  Louis Jackson is a 68 y.o. male who presents for the following: Skin Cancer Screening and Upper Body Skin Exam, hx of precancers, hx of seborrheic dermatitis using Ketoconazole  cream alternating with Hydrocortisone  cream with a good response.   The patient presents for Upper Body Skin Exam (UBSE) for skin cancer screening and mole check. The patient has spots, moles and lesions to be evaluated, some may be new or changing and the patient may have concern these could be cancer.  The following portions of the chart were reviewed this encounter and updated as appropriate: medications, allergies, medical history  Review of Systems:  No other skin or systemic complaints except as noted in HPI or Assessment and Plan.  Objective  Well appearing patient in no apparent distress; mood and affect are within normal limits.  All skin waist up examined. Relevant physical exam findings are noted in the Assessment and Plan.  right temple x1, scalp x 1 (2) Erythematous thin papules/macules with gritty scale.   Assessment & Plan   AK (ACTINIC KERATOSIS) (2) right temple x1, scalp x 1 (2) ACTINIC DAMAGE - chronic, secondary to cumulative UV radiation exposure/sun exposure over time - diffuse scaly erythematous macules with underlying dyspigmentation - Recommend daily broad spectrum sunscreen SPF 30+ to sun-exposed areas, reapply every 2 hours as needed.  - Recommend staying in the shade or wearing long sleeves, sun glasses (UVA+UVB protection) and wide brim hats (4-inch brim around the entire circumference of the hat). - Call for new or changing lesions.  Destruction of lesion - right temple x1, scalp x 1 (2) Complexity: simple   Destruction method: cryotherapy   Informed consent: discussed and consent obtained   Timeout:  patient name, date of birth, surgical site, and procedure verified Lesion destroyed using liquid nitrogen: Yes   Region frozen until ice ball  extended beyond lesion: Yes   Outcome: patient tolerated procedure well with no complications   Post-procedure details: wound care instructions given    Skin cancer screening performed today.  Lentigines, Seborrheic Keratoses, Hemangiomas - Benign normal skin lesions - Benign-appearing - Call for any changes  Melanocytic Nevi - Tan-brown and/or pink-flesh-colored symmetric macules and papules - Benign appearing on exam today - Observation - Call clinic for new or changing moles - Recommend daily use of broad spectrum spf 30+ sunscreen to sun-exposed areas.   Purpura - Chronic; persistent and recurrent.  Treatable, but not curable. - Violaceous macules and patches - Benign - Related to trauma, age, sun damage and/or use of blood thinners, chronic use of topical and/or oral steroids - Observe - Can use OTC arnica containing moisturizer such as Dermend Bruise Formula if desired - Call for worsening or other concerns   Seborrheic dermatitis Scalp, face Chronic and persistent condition with duration or expected duration over one year. Condition is bothersome/symptomatic for patient. Currently flared. Seborrheic Dermatitis  -  is a chronic persistent rash characterized by pinkness and scaling most commonly of the mid face but also can occur on the scalp (dandruff), ears; mid chest, mid back and groin.  It tends to be exacerbated by stress and cooler weather.  People who have neurologic disease may experience new onset or exacerbation of existing seborrheic dermatitis.  The condition is not curable but treatable and can be controlled.   Continue Ketoconazole  2% cream M, W, F at bedtime to face as needed. Continue  Hydrocortisone  2.5% cream  Tue., Thur., Sat. at bedtime as needed  only.   Topical steroids (such as triamcinolone, fluocinolone, fluocinonide, mometasone, clobetasol, halobetasol, betamethasone , hydrocortisone ) can cause thinning and lightening of the skin if they are used for  too long in the same area. Your physician has selected the right strength medicine for your problem and area affected on the body. Please use your medication only as directed by your physician to prevent side effects.   Return in 1 year (on 03/18/2025) for TBSE, hx of Aks.  IClara Crisp, CMA, am acting as scribe for Celine Collard, MD .   Documentation: I have reviewed the above documentation for accuracy and completeness, and I agree with the above.  Celine Collard, MD

## 2024-03-18 NOTE — Patient Instructions (Addendum)

## 2024-03-25 ENCOUNTER — Other Ambulatory Visit: Payer: Self-pay | Admitting: Podiatry

## 2024-03-25 DIAGNOSIS — M722 Plantar fascial fibromatosis: Secondary | ICD-10-CM | POA: Diagnosis not present

## 2024-03-25 MED ORDER — IBUPROFEN 800 MG PO TABS
800.0000 mg | ORAL_TABLET | Freq: Three times a day (TID) | ORAL | 1 refills | Status: AC
Start: 2024-03-25 — End: ?

## 2024-03-25 MED ORDER — OXYCODONE-ACETAMINOPHEN 5-325 MG PO TABS: 1.0000 | ORAL_TABLET | ORAL | 0 refills | Status: AC | PRN

## 2024-03-25 NOTE — Progress Notes (Signed)
 PRN postop

## 2024-03-27 LAB — LITHOLINK 24HR URINE PANEL
Ammonium, Urine: 20 mmol/(24.h) (ref 15–60)
Calcium Oxalate Saturation: 4.42 — ABNORMAL LOW (ref 6.00–10.00)
Calcium Phosphate Saturation: 0.63 (ref 0.50–2.00)
Calcium, Urine: 216 mg/(24.h) (ref <250)
Calcium/Creatinine Ratio: 159 mg/g{creat} (ref 34–196)
Calcium/Kg Body Weight: 2 mg/kg/d (ref <4.0)
Chloride, Urine: 164 mmol/(24.h) (ref 70–250)
Citrate, Urine: 391 mg/(24.h) — ABNORMAL LOW (ref >450)
Creatinine, Urine: 1356 mg/(24.h)
Creatinine/Kg Body Weight: 12.3 mg/kg/d (ref 11.9–24.4)
Cystine, Urine, Qualitative: NEGATIVE
Magnesium, Urine: 82 mg/(24.h) (ref 30–120)
Oxalate, Urine: 28 mg/(24.h) (ref 20–40)
Phosphorus, Urine: 892 mg/(24.h) (ref 600–1200)
Potassium, Urine: 54 mmol/(24.h) (ref 20–100)
Protein Catabolic Rate: 0.8 g/kg/d (ref 0.8–1.4)
Sodium, Urine: 179 mmol/(24.h) — ABNORMAL HIGH (ref 50–150)
Sulfate, Urine: 39 meq/(24.h) (ref 20–80)
Urea Nitrogen, Urine: 11.19 g/(24.h) (ref 6.00–14.00)
Uric Acid Saturation: 0.58 (ref <1.00)
Uric Acid, Urine: 595 mg/(24.h) (ref <800)
Urine Volume (Preserved): 2330 mL/(24.h) (ref 500–4000)
pH, 24 hr, Urine: 5.947 (ref 5.800–6.200)

## 2024-03-31 ENCOUNTER — Ambulatory Visit: Payer: Self-pay | Admitting: Urology

## 2024-04-02 ENCOUNTER — Ambulatory Visit (INDEPENDENT_AMBULATORY_CARE_PROVIDER_SITE_OTHER): Admitting: Podiatry

## 2024-04-02 ENCOUNTER — Encounter: Payer: Self-pay | Admitting: Podiatry

## 2024-04-02 VITALS — Ht 70.0 in | Wt 260.0 lb

## 2024-04-02 DIAGNOSIS — M722 Plantar fascial fibromatosis: Secondary | ICD-10-CM

## 2024-04-02 NOTE — Progress Notes (Signed)
   Chief Complaint  Patient presents with   Routine Post Op    Pt is here to f/u on bilateral feet due to surgery pt states his pain level has been at a 2 more of a stinging sensation, states everything is going well.    Subjective:  Patient presents today status post EPF bilateral.  DOS: 03/25/2024.  Doing well.  WBAT surgical shoe as instructed.  Minimal pain.  Past Medical History:  Diagnosis Date   Bradycardia    Chronic kidney disease    GERD (gastroesophageal reflux disease)    h/o   Gout    History of kidney stones    Hypertension    Lumbar stenosis    Nephrolithiasis    Obesity    OSA on CPAP    Pre-diabetes    Prostatitis    Pure hypercholesterolemia, unspecified    Renal cysts, acquired, bilateral     Past Surgical History:  Procedure Laterality Date   APPENDECTOMY     COLONOSCOPY WITH PROPOFOL  N/A 06/17/2016   Procedure: COLONOSCOPY WITH PROPOFOL ;  Surgeon: Cassie Click, MD;  Location: [facility] ENDOSCOPY;  Service: Endoscopy;  Laterality: N/A;   CYSTOSCOPY/URETEROSCOPY/HOLMIUM LASER/STENT PLACEMENT Left 07/25/2023   Procedure: CYSTOSCOPY/URETEROSCOPY/HOLMIUM LASER/STENT PLACEMENT;  Surgeon: Lawerence Pressman, MD;  Location: [facility] ORS;  Service: Urology;  Laterality: Left;   LITHOTRIPSY      No Known Allergies  Objective/Physical Exam Neurovascular status intact.  Incision well coapted with sutures intact. No sign of infectious process noted. No dehiscence. No active bleeding noted.  Minimal edema noted.  Overall well-healing incision sites   Assessment: 1. s/p EPF bilateral.. DOS: 03/25/2024   Plan of Care:  -Patient was evaluated.  -May begin washing and showering getting the foot wet. -Recommend Band-Aids over the incision sites -Compression ankle sleeve dispensed.  Wear daily -May transition out of the postoperative shoes in good supportive tennis shoes and sneakers -Return to clinic 1 week suture removal  *Retired.  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 Dot Gazella, DPM    [address] [place], [place] [address]                Office [phone]  Fax [phone]

## 2024-04-09 ENCOUNTER — Ambulatory Visit (INDEPENDENT_AMBULATORY_CARE_PROVIDER_SITE_OTHER): Admitting: Podiatry

## 2024-04-09 ENCOUNTER — Encounter: Payer: Self-pay | Admitting: Podiatry

## 2024-04-09 VITALS — Ht 70.0 in | Wt 260.0 lb

## 2024-04-09 DIAGNOSIS — M722 Plantar fascial fibromatosis: Secondary | ICD-10-CM

## 2024-04-09 NOTE — Progress Notes (Signed)
   Chief Complaint  Patient presents with   Routine Post Op    POV # 2 DOS 03/25/24 LT EPF, pt states both feet are fine some pain when he is on his feet more then he should.     Subjective:  Patient presents today status post EPF bilateral.  DOS: 03/25/2024.  Continues to do very well.  He is weightbearing in tennis shoes.  No pain.  Past Medical History:  Diagnosis Date   Bradycardia    Chronic kidney disease    GERD (gastroesophageal reflux disease)    h/o   Gout    History of kidney stones    Hypertension    Lumbar stenosis    Nephrolithiasis    Obesity    OSA on CPAP    Pre-diabetes    Prostatitis    Pure hypercholesterolemia, unspecified    Renal cysts, acquired, bilateral     Past Surgical History:  Procedure Laterality Date   APPENDECTOMY     COLONOSCOPY WITH PROPOFOL  N/A 06/17/2016   Procedure: COLONOSCOPY WITH PROPOFOL ;  Surgeon: Cassie Click, MD;  Location: [facility] ENDOSCOPY;  Service: Endoscopy;  Laterality: N/A;   CYSTOSCOPY/URETEROSCOPY/HOLMIUM LASER/STENT PLACEMENT Left 07/25/2023   Procedure: CYSTOSCOPY/URETEROSCOPY/HOLMIUM LASER/STENT PLACEMENT;  Surgeon: Lawerence Pressman, MD;  Location: [facility] ORS;  Service: Urology;  Laterality: Left;   LITHOTRIPSY      No Known Allergies  Objective/Physical Exam Neurovascular status intact.  Incision well coapted with sutures intact. No sign of infectious process noted. No dehiscence. No active bleeding noted.  Minimal edema noted.  Overall well-healing incision sites   Assessment: 1. s/p EPF bilateral.. DOS: 03/25/2024   Plan of Care:  -Patient was evaluated.  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 Dot Gazella, DPM    [address] [place], [place] [address]                 Office [phone]  Fax [phone]

## 2024-04-23 ENCOUNTER — Encounter: Admitting: Podiatry

## 2024-05-28 ENCOUNTER — Encounter: Payer: Self-pay | Admitting: Podiatry

## 2024-05-28 ENCOUNTER — Ambulatory Visit (INDEPENDENT_AMBULATORY_CARE_PROVIDER_SITE_OTHER): Admitting: Podiatry

## 2024-05-28 VITALS — Ht 70.0 in | Wt 260.0 lb

## 2024-05-28 DIAGNOSIS — M722 Plantar fascial fibromatosis: Secondary | ICD-10-CM

## 2024-05-28 NOTE — Progress Notes (Signed)
   Chief Complaint  Patient presents with   Routine Post Op     POV # 3 DOS 03/25/24 LT EPF, he states everything is going well, has no pain. No other complaints.    Subjective:  Patient presents today status post EPF bilateral.  DOS: 03/25/2024.  Doing well.  Currently wearing tennis shoes.  Past Medical History:  Diagnosis Date   Bradycardia    Chronic kidney disease    GERD (gastroesophageal reflux disease)    h/o   Gout    History of kidney stones    Hypertension    Lumbar stenosis    Nephrolithiasis    Obesity    OSA on CPAP    Pre-diabetes    Prostatitis    Pure hypercholesterolemia, unspecified    Renal cysts, acquired, bilateral     Past Surgical History:  Procedure Laterality Date   APPENDECTOMY     COLONOSCOPY WITH PROPOFOL  N/A 06/17/2016   Procedure: COLONOSCOPY WITH PROPOFOL ;  Surgeon: Lamar ONEIDA Holmes, MD;  Location: [facility] ENDOSCOPY;  Service: Endoscopy;  Laterality: N/A;   CYSTOSCOPY/URETEROSCOPY/HOLMIUM LASER/STENT PLACEMENT Left 07/25/2023   Procedure: CYSTOSCOPY/URETEROSCOPY/HOLMIUM LASER/STENT PLACEMENT;  Surgeon: Francisca Redell BROCKS, MD;  Location: [facility] ORS;  Service: Urology;  Laterality: Left;   LITHOTRIPSY      No Known Allergies  Objective/Physical Exam Neurovascular status intact.  Incisions are nicely healed.  No edema.  Muscle strength 5/5 all compartments.  Minimal tenderness to palpation   Assessment: 1. s/p EPF bilateral.. DOS: 03/25/2024   Plan of Care:  -Patient was evaluated.  - Continue good supportive tennis shoes and sneakers -Patient is essentially full activity with no restrictions with minor tenderness.  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 Thresa EMERSON Sar, DPM    [address] [place], [place] [address]                Office [phone]  Fax  [phone]

## 2024-07-08 ENCOUNTER — Encounter: Payer: Self-pay | Admitting: Urology

## 2024-08-10 ENCOUNTER — Encounter: Payer: Self-pay | Admitting: Urology

## 2024-10-14 ENCOUNTER — Other Ambulatory Visit: Payer: Self-pay

## 2024-10-14 ENCOUNTER — Emergency Department
Admission: EM | Admit: 2024-10-14 | Discharge: 2024-10-15 | Disposition: A | Attending: Emergency Medicine | Admitting: Emergency Medicine

## 2024-10-14 ENCOUNTER — Emergency Department

## 2024-10-14 DIAGNOSIS — W1830XA Fall on same level, unspecified, initial encounter: Secondary | ICD-10-CM | POA: Insufficient documentation

## 2024-10-14 DIAGNOSIS — R55 Syncope and collapse: Secondary | ICD-10-CM | POA: Insufficient documentation

## 2024-10-14 DIAGNOSIS — R270 Ataxia, unspecified: Secondary | ICD-10-CM | POA: Insufficient documentation

## 2024-10-14 DIAGNOSIS — E041 Nontoxic single thyroid nodule: Secondary | ICD-10-CM | POA: Diagnosis not present

## 2024-10-14 DIAGNOSIS — N189 Chronic kidney disease, unspecified: Secondary | ICD-10-CM | POA: Diagnosis not present

## 2024-10-14 DIAGNOSIS — R110 Nausea: Secondary | ICD-10-CM | POA: Diagnosis not present

## 2024-10-14 DIAGNOSIS — I129 Hypertensive chronic kidney disease with stage 1 through stage 4 chronic kidney disease, or unspecified chronic kidney disease: Secondary | ICD-10-CM | POA: Insufficient documentation

## 2024-10-14 DIAGNOSIS — G4733 Obstructive sleep apnea (adult) (pediatric): Secondary | ICD-10-CM | POA: Insufficient documentation

## 2024-10-14 NOTE — ED Provider Notes (Addendum)
 [facility] Provider Note    Event Date/Time   First MD Initiated Contact with Patient 10/14/24 2304     (approximate)   History   Fall   HPI  Louis Jackson is a 68 y.o. male   Past medical history of CKD, GERD, gout, kidney stones, hypertension, hyperlipidemia OSA on CPAP, prediabetic, here with syncopal event at home.  Last known normal was around 1015 when he sat down to check Kanavel score stood up and felt clammy, profound nausea, imbalance, lost consciousness and struck his head on the ground.  Downtime no longer than a couple minutes and his wife came after hearing outside, noted no seizure activity, no incontinence or tongue bite, and mentation was sharp afterward no postictal period  Continues to feel dizzy, mild nausea since the fall.  Has a mild right-sided headache after the fall only.  No associated chest pain, shortness of breath, palpitations prior to the fall.  No acute medical complaints otherwise, has been feeling well lately, no changes to medications, drug or alcohol use.  Aside from the right sided headache, also has right sided hamstring pain after the fall.   Independent Historian contributed to assessment above: Wife corroborates info above      Physical Exam   Triage Vital Signs: ED Triage Vitals  Encounter Vitals Group     BP      Girls Systolic BP Percentile      Girls Diastolic BP Percentile      Boys Systolic BP Percentile      Boys Diastolic BP Percentile      Pulse      Resp      Temp      Temp src      SpO2      Weight      Height      Head Circumference      Peak Flow      Pain Score      Pain Loc      Pain Education      Exclude from Growth Chart     Most recent vital signs: Vitals:   10/14/24 2330 10/15/24 0015  BP: 109/66 133/86  Pulse: 68 (!) 150  Resp: 18 17  Temp:    SpO2: 97% 100%    General: Awake, no distress.  CV:  Good peripheral perfusion.  Resp:  Normal effort.  Abd:  No  distention.  Other:  Pleasant gentleman with normal vital signs.  No facial asymmetry, motor or sensory deficits, and finger-to-nose coordination is normal.  No dysarthria.  However upon standing he starts to wave back-and-forth and when asked to close his eyes he falls over to the left side.  Clear lungs, no heart murmur, soft benign abdominal exam.  Skin appears warm well-perfused.   ED Results / Procedures / Treatments   Labs (all labs ordered are listed, but only abnormal results are displayed) Labs Reviewed  CBC WITH DIFFERENTIAL/PLATELET - Abnormal; Notable for the following components:      Result Value   Neutro Abs 8.4 (*)    All other components within normal limits  CBG MONITORING, ED - Abnormal; Notable for the following components:   Glucose-Capillary 118 (*)    All other components within normal limits  PROTIME-INR  APTT  COMPREHENSIVE METABOLIC PANEL WITH GFR  URINE DRUG SCREEN  TSH  T4, FREE  TROPONIN T, HIGH SENSITIVITY    I ordered and reviewed the above labs they are  notable for cell counts unremarkable.  EKG  ED ECG REPORT I, Ginnie Shams, the attending physician, personally viewed and interpreted this ECG.   Date: 10/14/2024  EKG Time: 2333  Rate: 66  Rhythm: sinus  Axis: nl  Intervals:nl  ST&T Change: no stemi    RADIOLOGY I independently reviewed and interpreted CT of the head and see no obvious bleed or midline shift I also reviewed radiologist's formal read.   PROCEDURES:  Critical Care performed: Yes, see critical care procedure note(s)  .Critical Care  Performed by: Shams Ginnie, MD Authorized by: Shams Ginnie, MD   Critical care provider statement:    Critical care time (minutes):  40   Critical care was time spent personally by me on the following activities:  Development of treatment plan with patient or surrogate, discussions with consultants, evaluation of patient's response to treatment, examination of patient, ordering and review  of laboratory studies, ordering and review of radiographic studies, ordering and performing treatments and interventions, pulse oximetry, re-evaluation of patient's condition and review of old charts    MEDICATIONS ORDERED IN ED: Medications - No data to display  External physician / consultants:  I spoke with telestroke neurologist regarding care plan for this patient.   IMPRESSION / MDM / ASSESSMENT AND PLAN / ED COURSE  I reviewed the triage vital signs and the nursing notes.                                Patient's presentation is most consistent with acute presentation with potential threat to life or bodily function.  Differential diagnosis includes, but is not limited to, vasovagal syncope, cardiac syncope, ACS, electrolyte disturbance, dysrhythmia, stroke, peripheral vertigo   The patient is on the cardiac monitor to evaluate for evidence of arrhythmia and/or significant heart rate changes.  MDM:    Presents like a vasovagal syncope episode with clamminess, nausea, dizziness and then a subsequent syncope with head strike and right hip injury.  Now feels mostly better but with ongoing nausea and ataxia on exam, within the thrombolytic window, activated as a code stroke for concern of posterior circulation stroke.  CT normal, telestroke neurologist agrees, may represent stroke, recommends basic metabolic workup first, check orthostatics, if no obvious etiology presents itself, anticipate admission for TIA/stroke workup.  Subsequent workup was unremarkable including labs and orthostatics; I notified hospitalist of plan of care and request for admission.  Addendum next day: I had communicated with admitting hospitalist about admit request and care plan around 2:30AM but it appears hospitalist consult via EPIC order was not ordered. See MD notes for remainder of subsequent hospital care plan.        FINAL CLINICAL IMPRESSION(S) / ED DIAGNOSES   Final diagnoses:  Thyroid  nodule  Ataxia  Syncope and collapse  Nausea     Rx / DC Orders   ED Discharge Orders     None        Note:  This document was prepared using Dragon voice recognition software and may include unintentional dictation errors.    Shams Ginnie, MD 10/15/24 9962    Shams Ginnie, MD 10/15/24 1451    Shams Ginnie, MD 10/15/24 [phone]

## 2024-10-14 NOTE — ED Triage Notes (Signed)
 Pt BIB [facility] from home for mechanical fall. Pt states I was clammy and I just went down.  -LOC, head strike?, takes ASA 81 mg D. C/o pain to rt. Hip and rt. Forehead. Had similar episode 7y ago.

## 2024-10-15 ENCOUNTER — Emergency Department

## 2024-10-15 LAB — COMPREHENSIVE METABOLIC PANEL WITH GFR
ALT: 20 U/L (ref 0–44)
AST: 23 U/L (ref 15–41)
Albumin: 4.1 g/dL (ref 3.5–5.0)
Alkaline Phosphatase: 74 U/L (ref 38–126)
Anion gap: 9 (ref 5–15)
BUN: 26 mg/dL — ABNORMAL HIGH (ref 8–23)
CO2: 28 mmol/L (ref 22–32)
Calcium: 9.4 mg/dL (ref 8.9–10.3)
Chloride: 103 mmol/L (ref 98–111)
Creatinine, Ser: 1.25 mg/dL — ABNORMAL HIGH (ref 0.61–1.24)
GFR, Estimated: >60 mL/min (ref >60)
Glucose, Bld: 127 mg/dL — ABNORMAL HIGH (ref 70–99)
Potassium: 4.5 mmol/L (ref 3.5–5.1)
Sodium: 140 mmol/L (ref 135–145)
Total Bilirubin: 0.4 mg/dL (ref 0.0–1.2)
Total Protein: 6.6 g/dL (ref 6.5–8.1)

## 2024-10-15 LAB — CBC WITH DIFFERENTIAL/PLATELET
Abs Immature Granulocytes: 0.04 K/uL (ref 0.00–0.07)
Basophils Absolute: 0 K/uL (ref 0.0–0.1)
Basophils Relative: 0 %
Eosinophils Absolute: 0.1 K/uL (ref 0.0–0.5)
Eosinophils Relative: 1 %
HCT: 45.1 % (ref 39.0–52.0)
Hemoglobin: 15.2 g/dL (ref 13.0–17.0)
Immature Granulocytes: 0 %
Lymphocytes Relative: 8 %
Lymphs Abs: 0.8 K/uL (ref 0.7–4.0)
MCH: 31.4 pg (ref 26.0–34.0)
MCHC: 33.7 g/dL (ref 30.0–36.0)
MCV: 93.2 fL (ref 80.0–100.0)
Monocytes Absolute: 0.5 K/uL (ref 0.1–1.0)
Monocytes Relative: 6 %
Neutro Abs: 8.4 K/uL — ABNORMAL HIGH (ref 1.7–7.7)
Neutrophils Relative %: 85 %
Platelets: 161 K/uL (ref 150–400)
RBC: 4.84 MIL/uL (ref 4.22–5.81)
RDW: 12.9 % (ref 11.5–15.5)
WBC: 9.9 K/uL (ref 4.0–10.5)
nRBC: 0 % (ref 0.0–0.2)

## 2024-10-15 LAB — URINE DRUG SCREEN
Amphetamines: NEGATIVE
Barbiturates: NEGATIVE
Benzodiazepines: NEGATIVE
Cocaine: NEGATIVE
Fentanyl: NEGATIVE
Methadone Scn, Ur: NEGATIVE
Opiates: NEGATIVE
Tetrahydrocannabinol: NEGATIVE

## 2024-10-15 LAB — CBG MONITORING, ED: Glucose-Capillary: 118 mg/dL — ABNORMAL HIGH (ref 70–99)

## 2024-10-15 LAB — PROTIME-INR
INR: 0.9 (ref 0.8–1.2)
Prothrombin Time: 12.8 s (ref 11.4–15.2)

## 2024-10-15 LAB — APTT: aPTT: 28 s (ref 24–36)

## 2024-10-15 LAB — T4, FREE: Free T4: 1.25 ng/dL — ABNORMAL HIGH (ref 0.61–1.12)

## 2024-10-15 LAB — TSH: TSH: 3.95 u[IU]/mL (ref 0.350–4.500)

## 2024-10-15 LAB — TROPONIN T, HIGH SENSITIVITY
Troponin T High Sensitivity: 16 ng/L (ref 0–19)
Troponin T High Sensitivity: <15 ng/L (ref 0–19)

## 2024-10-15 MED ORDER — ACETAMINOPHEN 500 MG PO TABS
1000.0000 mg | ORAL_TABLET | Freq: Once | ORAL | Status: AC
  Administered 2024-10-15: 1000 mg via ORAL
  Filled 2024-10-15: qty 2

## 2024-10-15 NOTE — ED Notes (Signed)
 Gave pt a cup of water per pt request. RN approved of this request at this time.

## 2024-10-15 NOTE — ED Provider Notes (Incomplete)
 [facility] Provider Note    Event Date/Time   First MD Initiated Contact with Patient 10/14/24 2304     (approximate)   History   No chief complaint on file.   HPI  Louis Jackson is a 68 y.o. male   Past medical history of ***    Last known normal was 1015, sat down to check NFL scores, stood up felt clammy, profound nausea, imbalance, lost consciousness struck head.  Downtime no longer than a couple minutes, no seizure activity noted by wife, no incontinence or tongue bite, mentation sharp afterwards.  Right sided headache unknown head strike, only after the fall, and ongoing nausea and dizziness.  Atraumatic exam moving all extremities, but with ataxia, falling to the  left side upon standing.   Stroke code activation at 12:01 AM.  Also  right hip pain, neuro intact, able to range, check hip x-ray, CT head, stroke labs, troponin, cardiac monitoring.  Independent Historian contributed to assessment above: ***  External Medical Documents Reviewed: ***      Physical Exam   Triage Vital Signs: ED Triage Vitals  Encounter Vitals Group     BP      Girls Systolic BP Percentile      Girls Diastolic BP Percentile      Boys Systolic BP Percentile      Boys Diastolic BP Percentile      Pulse      Resp      Temp      Temp src      SpO2      Weight      Height      Head Circumference      Peak Flow      Pain Score      Pain Loc      Pain Education      Exclude from Growth Chart     Most recent vital signs: There were no vitals filed for this visit.  General: Awake, no distress. *** CV:  Good peripheral perfusion. *** Resp:  Normal effort. *** Abd:  No distention. *** Other:  ***   ED Results / Procedures / Treatments   Labs (all labs ordered are listed, but only abnormal results are displayed) Labs Reviewed - No data to display   I ordered and reviewed the above labs they are notable for ***  EKG  ED ECG REPORT I,  Ginnie Shams, the attending physician, personally viewed and interpreted this ECG.   Date: 10/14/2024  EKG Time: ***  Rate: ***  Rhythm: {ekg findings:315101}  Axis: ***  Intervals:{conduction defects:17367}  ST&T Change: ***    RADIOLOGY I independently reviewed and interpreted *** I also reviewed radiologist's formal read.   PROCEDURES:  Critical Care performed: {CriticalCareYesNo:19197::Yes, see critical care procedure note(s),No}  Procedures   MEDICATIONS ORDERED IN ED: Medications - No data to display  External physician / consultants:  I spoke with *** regarding care plan for this patient.   IMPRESSION / MDM / ASSESSMENT AND PLAN / ED COURSE  I reviewed the triage vital signs and the nursing notes.                                Patient's presentation is most consistent with {EM COPA:27473}  Differential diagnosis includes, but is not limited to, ***   ***The patient is on the cardiac monitor to evaluate for evidence of arrhythmia  and/or significant heart rate changes.  MDM:  ***  I considered hospitalization for admission or observation ***        FINAL CLINICAL IMPRESSION(S) / ED DIAGNOSES   Final diagnoses:  None     Rx / DC Orders   ED Discharge Orders     None        Note:  This document was prepared using Dragon voice recognition software and may include unintentional dictation errors.

## 2024-10-15 NOTE — Progress Notes (Signed)
   10/15/24 0005  Spiritual Encounters  Type of Visit Initial  Care provided to: Pt and family  Conversation partners present during encounter Nurse  Referral source Code page  Reason for visit Code  OnCall Visit No  Spiritual Framework  Presenting Themes Coping tools  Community/Connection Family;Friend(s);Faith community  Patient Stress Factors None identified  Family Stress Factors None identified  Interventions  Spiritual Care Interventions Made Established relationship of care and support;Compassionate presence  Spiritual Care Plan  Spiritual Care Issues Still Outstanding No further spiritual care needs at this time (see row info)  Advance Directives (For Healthcare)  Does Patient Have a Medical Advance Directive? No  Mental Health Advance Directives  Does Patient Have a Mental Health Advance Directive? No   Chaplain provided care and compassionate presence to code stroke patient and extended family during this unfamiliar health challenge. Wife asked for prayer and chaplain obliged.  Zymire Turnbo Chaplain Intern-[facility]

## 2024-10-15 NOTE — Discharge Instructions (Signed)
 Please drink plenty fluids and avoid any significant physical exertion for the next couple days.  Please follow-up with your doctor regarding visit.  Return to the emergency department for any chest pain shortness of breath any weakness or numbness of any arm or leg confusion slurred speech or for any other symptom personally concerning to yourself.  Your CT scan did show an incidental thyroid nodule.  Please follow-up with your primary care doctor to obtain a nonemergent ultrasound to further evaluate.

## 2024-10-15 NOTE — Consult Note (Signed)
 [facility]   Patient Name:   Louis Jackson, Louis Jackson Date of Birth:   1956/09/22 Identification Number:   MRN - [id number] Date of Service:   10/15/2024 00:04:19  Diagnosis:       R42 - Dizziness/ Vertigo/ Giddiness       R26.81 - Unsteady gait  Impression:      68 yo M who presented for evaluation of acute onset of postural lightheadedness, nausea, unsteady gait, and subsequent fall with head injury. His neurologic exam was grossly non-focal, though he was noted to have a tendency to fall to the left when standing per discussion with ED provider. Given his fairly non-specific symptoms, differential diagnosis is fairly broad and may include cardiac/blood pressure-related etiology (including orthostatic hypotension given the postural pattern of the lightheadedness) , metabolic derangement, various causes of presyncope (including vasovagal/reflex presyncope), peripheral vertigo (though patient denied any frank spinning sensation to strongly suggest this), or posterior circulation stroke/TIA (though would be unlikely to explain all of this symptoms). Would not recommend thrombolytics as patient did not feel his symptoms were disabling (and would also be hesitant due to the head injury regardless). Additionally, there is low clinical suspicion for proximal LVO. Recommend checking orthostatic vital signs and basic metabolic workup. Unless clear etiology is found on initial workup, would then consider treating for possible posterior circulation TIA/stroke as discussed below.  Our recommendations are outlined below.  Recommendations:        Neuro Checks (Q4)       Bedside Swallow Eval       DVT Prophylaxis       IV Fluids, Normal Saline       Head of Bed 30 Degrees       Euglycemia and Avoid Hyperthermia (PRN Acetaminophen )       Continue home ASA for stroke prevention; would be hesitant to start Plavix at this time given recent fall with head injury and  lower clinical suspicion for acute stroke       Please check orthostatic vital signs and treat as appropriate if positive       Basic metabolic/infectious workup per primary team       If above workup is unremarkable, would then treat empirically for possible stroke with admission for further stroke/TIA workup (including MRI brain without contrast, MRA head/neck without contrast OR CTA head/neck, TTE, telemetry, stroke labs, etc.)       PT evaluation for formal gait safety assessment  Sign Out:       Discussed with Emergency Department Provider  ------------------------------------------------------------------------------  Advanced Imaging: Advanced Imaging Deferred because:  Non-disabling symptoms as verified by the patient; no cortical signs so not consistent with LVO   Metrics: Last Known Well: 10/14/2024 22:15:00 Arrival Time: 10/14/2024 23:02:00 Activation Time: 10/15/2024 00:04:19 Initial Response Time: 10/15/2024 00:05:32 Symptoms: unsteady gait, lightheadedness, and fall. Initial patient interaction: 10/15/2024 00:26:09 NIHSS Assessment Completed: 10/15/2024 00:35:05 Patient is not a candidate for Thrombolytic. Thrombolytic Medical Decision: 10/15/2024 00:35:06 Patient was not deemed candidate for Thrombolytic because of following reasons: Stroke severity too mild (non-disabling) . Patient/Family declined .  CT Head: I personally reviewed all the CT images that were available to me and it showed: no acute hemorrhage  Primary Provider Notified of Diagnostic Impression and Management Plan on: 10/15/2024 00:42:27   ------------------------------------------------------------------------------  History of Present Illness: Patient is a 68 year old Male.  Patient was brought by EMS for symptoms of unsteady gait, lightheadedness, and fall. Patient reports that he was last known  normal around 10:15 PM this on the evening of 12/4. Around that time, patient was sitting down  and felt onset of nausea and dizziness (described as a lightheadedness rather than spinning sensation). He stood up to walk to bed and noted unsteadiness to the point where he was bumping into the wall and furniture. He then fell and hit his head. He denied any loss of consciousness, convulsions, spinning sensation/vertigo, or focal numbness/weakness. Afterwards, he reported feeling diaphoretic with transient blurry vision and a mild right frontal headache. He recalls similar dizziness 7 years ago and again 20 years ago in the setting of cardiac/blood pressure issues. At the time of my evaluation, he only reported a mild persistent headache after the fall but was noted to have lightheadedness and a tendency to fall to the left upon standing with the ED provider earlier.   Past Medical History:      Hypertension      Hyperlipidemia  Medications:  No Anticoagulant use  Antiplatelet use: Yes ASA Reviewed EMR for current medications  Allergies:  NKDA  Social History: Smoking: Former  Family History:  There is no family history of premature cerebrovascular disease pertinent to this consultation  ROS : 14 Points Review of Systems was performed and was negative except mentioned in HPI.  Past Surgical History: There Is No Surgical History Contributory To Today's Visit    Examination: BP(121/79), Pulse(67), Blood Glucose(118) 1A: Level of Consciousness - Alert; keenly responsive + 0 1B: Ask Month and Age - Both Questions Right + 0 1C: Blink Eyes & Squeeze Hands - Performs Both Tasks + 0 2: Test Horizontal Extraocular Movements - Normal + 0 3: Test Visual Fields - No Visual Loss + 0 4: Test Facial Palsy (Use Grimace if Obtunded) - Normal symmetry + 0 5A: Test Left Arm Motor Drift - No Drift for 10 Seconds + 0 5B: Test Right Arm Motor Drift - No Drift for 10 Seconds + 0 6A: Test Left Leg Motor Drift - No Drift for 5 Seconds + 0 6B: Test Right Leg Motor Drift - No Drift for 5 Seconds +  0 7: Test Limb Ataxia (FNF/Heel-Shin) - No Ataxia + 0 8: Test Sensation - Normal; No sensory loss + 0 9: Test Language/Aphasia - Normal; No aphasia + 0 10: Test Dysarthria - Normal + 0 11: Test Extinction/Inattention - No abnormality + 0  NIHSS Score: 0  NIHSS Free Text : Unsteady gait noted by ED provider with associated lightheadedness and a tendency to fall to the left; gait assessment not repeated due to fall concern  Pre-Morbid Modified Rankin Scale: 0 Points = No symptoms at all  Spoke with : Dr. Cyrena I reviewed the available imaging via Rapid and initiated discussion with the primary provider  This consult was conducted in real time using interactive audio and video technology. Patient was informed of the technology being used for this visit and agreed to proceed. Patient located in hospital and provider located at home/office setting.  Patient is being evaluated for possible acute neurologic impairment and high probability of imminent or life-threatening deterioration. I spent total of 45 minutes providing care to this patient, including time for face to face visit via telemedicine, review of medical records, imaging studies and discussion of findings with providers, the patient and/or family.  Dr Eva De'Prey  [facility] For Inpatient follow-up with [facility] physician please call [facility] at [phone]. As we are not an outpatient service for any post hospital discharge needs please contact the  hospital for assistance. If you have any questions for the [facility] physicians or need to reconsult for clinical or diagnostic changes please contact us  via [facility] at [phone].  Non-radiologist review of imaging performed to assist with emergent clinical decision-making. Remote physician workstations do not possess the same resolution, calibration, or diagnostic capabilities as hospital-based radiology reading stations, and formal radiologist read is necessary.

## 2024-10-15 NOTE — ED Provider Notes (Signed)
-----------------------------------------   11:43 AM on 10/15/2024 ----------------------------------------- I have personally seen and evaluated the patient today.  Patient states he is feeling much better.  Patient was supposed to be admitted overnight however it appears that the hospitalist was not made aware of the patient.  I spoke to the patient he is upset that nothing has happened as he has been here for over 12 hours now.  Patient states he would rather just go home.  I had a discussion with the patient and he is agreeable to stay for an MRI.  If the MRI is negative then the patient will be discharged home, if the MRI is positive then the patient is agreeable to admission for further workup which I believe is a reasonable plan of care.  MRI has been negative.  Patient is comfortable going home.  Patient will follow-up with his doctor.  Symptoms seem more suggestive of vasovagal episode.   Dorothyann Drivers, MD 10/15/24 [phone]

## 2024-10-15 NOTE — ED Notes (Signed)
 Assisted pt to the bathroom. Pt is back in bed back on the cardiac monitor. Call bell by bedside and bed is locked and in the lowest position.

## 2025-02-02 ENCOUNTER — Ambulatory Visit: Admitting: Urology

## 2025-02-03 ENCOUNTER — Ambulatory Visit: Admitting: Urology

## 2025-02-08 ENCOUNTER — Ambulatory Visit: Admitting: Urology

## 2025-02-25 ENCOUNTER — Ambulatory Visit: Admitting: Urology

## 2025-03-31 ENCOUNTER — Ambulatory Visit: Admitting: Dermatology

## 2025-04-14 ENCOUNTER — Ambulatory Visit: Admitting: Dermatology

## 2025-04-18 ENCOUNTER — Ambulatory Visit: Admitting: Dermatology
